# Patient Record
Sex: Female | Born: 1940 | Race: White | Hispanic: No | Smoking: Former smoker
Health system: Southern US, Community
[De-identification: ages and names within clinical notes are randomized; demographics above are authoritative.]

## PROBLEM LIST (undated history)

## (undated) DIAGNOSIS — F32A Depression, unspecified: Secondary | ICD-10-CM

## (undated) DIAGNOSIS — G35 Multiple sclerosis: Secondary | ICD-10-CM

## (undated) DIAGNOSIS — I10 Essential (primary) hypertension: Secondary | ICD-10-CM

## (undated) DIAGNOSIS — F329 Major depressive disorder, single episode, unspecified: Secondary | ICD-10-CM

## (undated) DIAGNOSIS — R413 Other amnesia: Secondary | ICD-10-CM

## (undated) DIAGNOSIS — H539 Unspecified visual disturbance: Secondary | ICD-10-CM

## (undated) DIAGNOSIS — E785 Hyperlipidemia, unspecified: Secondary | ICD-10-CM

## (undated) DIAGNOSIS — M1712 Unilateral primary osteoarthritis, left knee: Secondary | ICD-10-CM

## (undated) DIAGNOSIS — N3281 Overactive bladder: Secondary | ICD-10-CM

## (undated) HISTORY — DX: Unilateral primary osteoarthritis, left knee: M17.12

## (undated) HISTORY — DX: Depression, unspecified: F32.A

## (undated) HISTORY — DX: Unspecified visual disturbance: H53.9

## (undated) HISTORY — DX: Multiple sclerosis: G35

## (undated) HISTORY — DX: Major depressive disorder, single episode, unspecified: F32.9

## (undated) HISTORY — DX: Overactive bladder: N32.81

## (undated) HISTORY — DX: Essential (primary) hypertension: I10

## (undated) HISTORY — DX: Hyperlipidemia, unspecified: E78.5

## (undated) HISTORY — DX: Other amnesia: R41.3

## (undated) HISTORY — PX: CATARACT EXTRACTION: SUR2

---

## 1998-07-07 ENCOUNTER — Encounter: Admission: RE | Admit: 1998-07-07 | Discharge: 1998-10-05 | Payer: Self-pay | Admitting: Gynecology

## 1998-08-05 ENCOUNTER — Other Ambulatory Visit: Admission: RE | Admit: 1998-08-05 | Discharge: 1998-08-05 | Payer: Self-pay | Admitting: Gynecology

## 1998-10-07 ENCOUNTER — Encounter: Admission: RE | Admit: 1998-10-07 | Discharge: 1999-01-05 | Payer: Self-pay | Admitting: Gynecology

## 1999-04-30 ENCOUNTER — Encounter: Admission: RE | Admit: 1999-04-30 | Discharge: 1999-07-29 | Payer: Self-pay | Admitting: Gynecology

## 1999-09-27 ENCOUNTER — Other Ambulatory Visit: Admission: RE | Admit: 1999-09-27 | Discharge: 1999-09-27 | Payer: Self-pay | Admitting: Gynecology

## 2000-11-29 ENCOUNTER — Other Ambulatory Visit: Admission: RE | Admit: 2000-11-29 | Discharge: 2000-11-29 | Payer: Self-pay | Admitting: Gynecology

## 2003-03-29 HISTORY — PX: COLONOSCOPY W/ POLYPECTOMY: SHX1380

## 2003-06-12 ENCOUNTER — Encounter (INDEPENDENT_AMBULATORY_CARE_PROVIDER_SITE_OTHER): Payer: Self-pay | Admitting: Gastroenterology

## 2003-06-12 ENCOUNTER — Ambulatory Visit (HOSPITAL_COMMUNITY): Admission: RE | Admit: 2003-06-12 | Discharge: 2003-06-12 | Payer: Self-pay | Admitting: Gastroenterology

## 2003-06-12 ENCOUNTER — Encounter (INDEPENDENT_AMBULATORY_CARE_PROVIDER_SITE_OTHER): Payer: Self-pay | Admitting: Specialist

## 2003-06-12 DIAGNOSIS — Z860100 Personal history of colon polyps, unspecified: Secondary | ICD-10-CM | POA: Insufficient documentation

## 2004-02-17 ENCOUNTER — Other Ambulatory Visit: Admission: RE | Admit: 2004-02-17 | Discharge: 2004-02-17 | Payer: Self-pay | Admitting: Gynecology

## 2005-05-09 ENCOUNTER — Other Ambulatory Visit: Admission: RE | Admit: 2005-05-09 | Discharge: 2005-05-09 | Payer: Self-pay | Admitting: Gynecology

## 2006-06-29 ENCOUNTER — Other Ambulatory Visit: Admission: RE | Admit: 2006-06-29 | Discharge: 2006-06-29 | Payer: Self-pay | Admitting: Gynecology

## 2007-06-25 ENCOUNTER — Other Ambulatory Visit: Admission: RE | Admit: 2007-06-25 | Discharge: 2007-06-25 | Payer: Self-pay | Admitting: Gynecology

## 2007-08-03 DIAGNOSIS — G35 Multiple sclerosis: Secondary | ICD-10-CM | POA: Insufficient documentation

## 2007-08-06 ENCOUNTER — Ambulatory Visit: Payer: Self-pay | Admitting: Internal Medicine

## 2007-08-06 DIAGNOSIS — E669 Obesity, unspecified: Secondary | ICD-10-CM

## 2007-08-06 DIAGNOSIS — F411 Generalized anxiety disorder: Secondary | ICD-10-CM | POA: Insufficient documentation

## 2007-08-06 DIAGNOSIS — F329 Major depressive disorder, single episode, unspecified: Secondary | ICD-10-CM

## 2007-08-06 DIAGNOSIS — M129 Arthropathy, unspecified: Secondary | ICD-10-CM | POA: Insufficient documentation

## 2007-08-06 DIAGNOSIS — I10 Essential (primary) hypertension: Secondary | ICD-10-CM

## 2007-08-06 DIAGNOSIS — E785 Hyperlipidemia, unspecified: Secondary | ICD-10-CM | POA: Insufficient documentation

## 2007-08-06 DIAGNOSIS — E119 Type 2 diabetes mellitus without complications: Secondary | ICD-10-CM | POA: Insufficient documentation

## 2008-05-30 ENCOUNTER — Encounter (INDEPENDENT_AMBULATORY_CARE_PROVIDER_SITE_OTHER): Payer: Self-pay | Admitting: *Deleted

## 2009-08-05 ENCOUNTER — Encounter (INDEPENDENT_AMBULATORY_CARE_PROVIDER_SITE_OTHER): Payer: Self-pay | Admitting: *Deleted

## 2009-08-05 ENCOUNTER — Telehealth (INDEPENDENT_AMBULATORY_CARE_PROVIDER_SITE_OTHER): Payer: Self-pay | Admitting: *Deleted

## 2009-08-21 ENCOUNTER — Encounter (INDEPENDENT_AMBULATORY_CARE_PROVIDER_SITE_OTHER): Payer: Self-pay | Admitting: *Deleted

## 2009-08-26 ENCOUNTER — Encounter (INDEPENDENT_AMBULATORY_CARE_PROVIDER_SITE_OTHER): Payer: Self-pay | Admitting: *Deleted

## 2009-08-26 ENCOUNTER — Ambulatory Visit: Payer: Self-pay | Admitting: Internal Medicine

## 2009-08-28 ENCOUNTER — Telehealth (INDEPENDENT_AMBULATORY_CARE_PROVIDER_SITE_OTHER): Payer: Self-pay

## 2010-02-23 ENCOUNTER — Ambulatory Visit: Payer: Self-pay | Admitting: Gynecology

## 2010-02-23 ENCOUNTER — Other Ambulatory Visit: Admission: RE | Admit: 2010-02-23 | Discharge: 2010-02-23 | Payer: Self-pay | Admitting: Gynecology

## 2010-03-03 ENCOUNTER — Ambulatory Visit: Payer: Self-pay | Admitting: Gynecology

## 2010-03-24 ENCOUNTER — Telehealth: Payer: Self-pay | Admitting: Internal Medicine

## 2010-03-24 DIAGNOSIS — R195 Other fecal abnormalities: Secondary | ICD-10-CM

## 2010-03-26 ENCOUNTER — Encounter (INDEPENDENT_AMBULATORY_CARE_PROVIDER_SITE_OTHER): Payer: Self-pay | Admitting: *Deleted

## 2010-03-31 ENCOUNTER — Encounter: Payer: Self-pay | Admitting: Internal Medicine

## 2010-03-31 ENCOUNTER — Ambulatory Visit
Admission: RE | Admit: 2010-03-31 | Discharge: 2010-03-31 | Payer: Self-pay | Source: Home / Self Care | Attending: Internal Medicine | Admitting: Internal Medicine

## 2010-04-05 ENCOUNTER — Ambulatory Visit (HOSPITAL_COMMUNITY)
Admission: RE | Admit: 2010-04-05 | Discharge: 2010-04-05 | Payer: Self-pay | Source: Home / Self Care | Attending: Internal Medicine | Admitting: Internal Medicine

## 2010-04-05 ENCOUNTER — Encounter: Payer: Self-pay | Admitting: Internal Medicine

## 2010-04-12 LAB — GLUCOSE, CAPILLARY: Glucose-Capillary: 109 mg/dL — ABNORMAL HIGH (ref 70–99)

## 2010-04-27 NOTE — Miscellaneous (Signed)
Summary: [facility] PV  Clinical Lists Changes  Medications: Added new medication of MOVIPREP 100 GM  SOLR (PEG-KCL-NACL-NASULF-NA ASC-C) As per prep instructions. - Signed Rx of MOVIPREP 100 GM  SOLR (PEG-KCL-NACL-NASULF-NA ASC-C) As per prep instructions.;  #1 x 0;  Signed;  Entered by: Ezra Sites RN;  Authorized by: Iva Boop MD, [facility];  Method used: Electronically to [facility] [address], [place], [place], [place]  [address], Ph: [phone], Fax: [phone] Observations: Added new observation of NKA: T (08/26/2009 13:51)    Prescriptions: MOVIPREP 100 GM  SOLR (PEG-KCL-NACL-NASULF-NA ASC-C) As per prep instructions.  #1 x 0   Entered by:   Ezra Sites RN   Authorized by:   Iva Boop MD, [facility]   Signed by:   Ezra Sites RN on 08/26/2009   Method used:   Electronically to        [facility] (retail)       [address]       [facility]       [place], [place]  [address]       Ph: [phone]       Fax: [phone]   RxID:   [id number]

## 2010-04-27 NOTE — Letter (Signed)
Summary: Previsit letter  [facility]  [address] [place], [place] [address]   Phone: [phone]  Fax: [phone]       08/05/2009 MRN: [id number]  [facility] [address] [place], [place]  [address]  Dear Ms. Lembke,  Welcome to the Gastroenterology Division at [facility].    You are scheduled to see a nurse for your pre-procedure visit on 08/26/09 at 2:30pm on the 3rd floor at [facility], [address]  We ask that you try to arrive at our office 15 minutes prior to your appointment time to allow for check-in.  Your nurse visit will consist of discussing your medical and surgical history, your immediate family medical history, and your medications.    Please bring a complete list of all your medications or, if you prefer, bring the medication bottles and we will list them.  We will need to be aware of both prescribed and over the counter drugs.  We will need to know exact dosage information as well.  If you are on blood thinners (Coumadin, Plavix, Aggrenox, Ticlid, etc.) please call our office today/prior to your appointment, as we need to consult with your physician about holding your medication.   Please be prepared to read and sign documents such as consent forms, a financial agreement, and acknowledgement forms.  If necessary, and with your consent, a friend or relative is welcome to sit-in on the nurse visit with you.  Please bring your insurance card so that we may make a copy of it.  If your insurance requires a referral to see a specialist, please bring your referral form from your primary care physician.  No co-pay is required for this nurse visit.     If you cannot keep your appointment, please call [phone] to cancel or reschedule prior to your appointment date.  This allows [place] the opportunity to schedule an appointment for another patient in need of care.    Thank you for choosing [facility] for your medical needs.  We  appreciate the opportunity to care for you.  Please visit [place] at our website  to learn more about our practice.                     Sincerely.                                                                                                                   The Gastroenterology Division

## 2010-04-27 NOTE — Progress Notes (Signed)
Summary: Schedule recall colonscopy  Phone Note Outgoing Call Call back at [facility] Phone [phone]   Call placed by: Christie Nottingham CMA [facility]),  Aug 05, 2009 4:03 PM Call placed to: Patient Summary of Call: Called pt to schedule recall colonoscopy and pt scheduled for 08/28/09. Letter mailed for reminder appt.  Initial call taken by: Christie Nottingham CMA [facility]),  Aug 05, 2009 4:03 PM

## 2010-04-27 NOTE — Letter (Signed)
Summary: Diabetic Instructions  [facility]  [address] [place], [place] [address]   Phone: [phone]  Fax: [phone]    Elaine Wells 11-Feb-1941 MRN: [id number]   _  _   ORAL DIABETIC MEDICATION INSTRUCTIONS  The day before your procedure:   Take your diabetic pill as you do normally  The day of your procedure:   Do not take your diabetic pill    We will check your blood sugar levels during the admission process and again in Recovery before discharging you home  ________________________________________________________________________

## 2010-04-27 NOTE — Progress Notes (Signed)
Summary: Cancel colonoscopy  Patient came in today on the 4th floor  for her scheduled colonoscopy , she reported to the reception desk she wanted to have her procedure at the hospital and not in [facility].  Patient's reason was her husband died during a procedure and she was too scared to have a procedure out of the hospital.  Quincy Carnes, RN attempted to speak with her and reassure her that it is safe to have the procedure here. She states that when she came for the pre-visit she thought she would be able to have it done here, but now she feels she won't be safe unless she is in the hospital.  Patient states no one can change her mind and she will only have the colon at the hospital.  I spoke with Dr Leone Payor, he is unable to accomodate her today and asked me to help her reschedule to his next hospital week.  I attempted to reschedule the patient, but she refused.  Patient  says she wants to think about it and call us back if she is interested.  We have canceled her appointment.  Darcey Nora RN, [facility]  August 28, 2009 2:56 PM

## 2010-04-27 NOTE — Letter (Signed)
Summary: [facility] Instructions  [facility]  [address] [place], [place] [address]   Phone: [phone]  Fax: [phone]       Elaine Wells    09-May-1940    MRN: [id number]        Procedure Day /Date: Friday 08/28/2009     Arrival Time: 2:00 pm      Procedure Time: 3:00 pm     Location of Procedure:                    _ x_  [facility] (4th Floor)                        PREPARATION FOR COLONOSCOPY WITH MOVIPREP   Starting 5 days prior to your procedure Sunday 5/29 do not eat nuts, seeds, popcorn, corn, beans, peas,  salads, or any raw vegetables.  Do not take any fiber supplements (e.g. Metamucil, Citrucel, and Benefiber).  THE DAY BEFORE YOUR PROCEDURE         DATE: Thursday 6/2  1.  Drink clear liquids the entire day-NO SOLID FOOD  2.  Do not drink anything colored red or purple.  Avoid juices with pulp.  No orange juice.  3.  Drink at least 64 oz. (8 glasses) of fluid/clear liquids during the day to prevent dehydration and help the prep work efficiently.  CLEAR LIQUIDS INCLUDE: Water Jello Ice Popsicles Tea (sugar ok, no milk/cream) Powdered fruit flavored drinks Coffee (sugar ok, no milk/cream) Gatorade Juice: apple, white grape, white cranberry  Lemonade Clear bullion, consomm, broth Carbonated beverages (any kind) Strained chicken noodle soup Hard Candy                             4.  In the morning, mix first dose of MoviPrep solution:    Empty 1 Pouch A and 1 Pouch B into the disposable container    Add lukewarm drinking water to the top line of the container. Mix to dissolve    Refrigerate (mixed solution should be used within 24 hrs)  5.  Begin drinking the prep at 5:00 p.m. The MoviPrep container is divided by 4 marks.   Every 15 minutes drink the solution down to the next mark (approximately 8 oz) until the full liter is complete.   6.  Follow completed prep with 16 oz of clear liquid of your choice (Nothing red or  purple).  Continue to drink clear liquids until bedtime.  7.  Before going to bed, mix second dose of MoviPrep solution:    Empty 1 Pouch A and 1 Pouch B into the disposable container    Add lukewarm drinking water to the top line of the container. Mix to dissolve    Refrigerate  THE DAY OF YOUR PROCEDURE      DATE: Friday 6/3  Beginning at 10:00 a.m. (5 hours before procedure):         1. Every 15 minutes, drink the solution down to the next mark (approx 8 oz) until the full liter is complete.  2. Follow completed prep with 16 oz. of clear liquid of your choice.    3. You may drink clear liquids until 10:00 am  (2 HOURS BEFORE PROCEDURE).   MEDICATION INSTRUCTIONS  Unless otherwise instructed, you should take regular prescription medications with a small sip of water   as early as possible the morning of  your procedure.  Diabetic patients - see separate instructions.  Additional medication instructions:  Do not take Lisinopril/HCTZ day of procedure.         OTHER INSTRUCTIONS  You will need a responsible adult at least 70 years of age to accompany you and drive you home.   This person must remain in the waiting room during your procedure.  Wear loose fitting clothing that is easily removed.  Leave jewelry and other valuables at home.  However, you may wish to bring a book to read or  an iPod/MP3 player to listen to music as you wait for your procedure to start.  Remove all body piercing jewelry and leave at home.  Total time from sign-in until discharge is approximately 2-3 hours.  You should go home directly after your procedure and rest.  You can resume normal activities the  day after your procedure.  The day of your procedure you should not:   Drive   Make legal decisions   Operate machinery   Drink alcohol   Return to work  You will receive specific instructions about eating, activities and medications before you leave.    The above  instructions have been reviewed and explained to me by   Ezra Sites RN  August 26, 2009 2:32 PM     I fully understand and can verbalize these instructions _____________________________ Date _________

## 2010-04-29 NOTE — Miscellaneous (Signed)
Summary: [facility] PV  Clinical Lists Changes  Medications: Added new medication of MOVIPREP 100 GM  SOLR (PEG-KCL-NACL-NASULF-NA ASC-C) As per prep instructions. - Signed Rx of MOVIPREP 100 GM  SOLR (PEG-KCL-NACL-NASULF-NA ASC-C) As per prep instructions.;  #1 x 0;  Signed;  Entered by: Ezra Sites RN;  Authorized by: Iva Boop MD, [facility];  Method used: Electronically to [facility] [address] [place], [place], [place]  [address], Ph: [phone], Fax: [phone] Observations: Added new observation of NKA: T (03/31/2010 14:52)    Prescriptions: MOVIPREP 100 GM  SOLR (PEG-KCL-NACL-NASULF-NA ASC-C) As per prep instructions.  #1 x 0   Entered by:   Ezra Sites RN   Authorized by:   Iva Boop MD, [facility]   Signed by:   Ezra Sites RN on 03/31/2010   Method used:   Electronically to        [facility] (retail)       [address]       [place]       [place], [place]  [address]       Ph: [phone]       Fax: [phone]   RxID:   [id number]

## 2010-04-29 NOTE — Procedures (Signed)
Summary: Colon Instructions  Colon Instructions   Imported By: Lester Wagram 04/06/2010 08:49:40  _____________________________________________________________________  External Attachment:    Type:   Image     Comment:   External Document

## 2010-04-29 NOTE — Letter (Signed)
Summary: [facility] Instructions  [facility]  [address] [place], [place] [address]   Phone: [phone]  Fax: [phone]       LOUANN HOPSON    Aug 18, 1954    MRN: [id number]        Procedure Day /Date: Monday 04/05/2010     Arrival Time: 7:30AM     Procedure Time: 8:30AM     Location of Procedure:                     _X_  [facility] ( Outpatient Registration)                       PREPARATION FOR COLONOSCOPY WITH MOVIPREP   Starting 5 days prior to your procedure 03/31/2010 do not eat nuts, seeds, popcorn, corn, beans, peas,  salads, or any raw vegetables.  Do not take any fiber supplements (e.g. Metamucil, Citrucel, and Benefiber).  THE DAY BEFORE YOUR PROCEDURE         DATE: 1/8     DAY: [place]  1.  Drink clear liquids the entire day-NO SOLID FOOD  2.  Do not drink anything colored red or purple.  Avoid juices with pulp.  No orange juice.  3.  Drink at least 64 oz. (8 glasses) of fluid/clear liquids during the day to prevent dehydration and help the prep work efficiently.  CLEAR LIQUIDS INCLUDE: Water Jello Ice Popsicles Tea (sugar ok, no milk/cream) Powdered fruit flavored drinks Coffee (sugar ok, no milk/cream) Gatorade Juice: apple, white grape, white cranberry  Lemonade Clear bullion, consomm, broth Carbonated beverages (any kind) Strained chicken noodle soup Hard Candy                             4.  In the morning, mix first dose of MoviPrep solution:    Empty 1 Pouch A and 1 Pouch B into the disposable container    Add lukewarm drinking water to the top line of the container. Mix to dissolve    Refrigerate (mixed solution should be used within 24 hrs)  5.  Begin drinking the prep at 5:00 p.m. The MoviPrep container is divided by 4 marks.   Every 15 minutes drink the solution down to the next mark (approximately 8 oz) until the full liter is complete.   6.  Follow completed prep with 16 oz of clear liquid of your choice  (Nothing red or purple).  Continue to drink clear liquids until bedtime.  7.  Before going to bed, mix second dose of MoviPrep solution:    Empty 1 Pouch A and 1 Pouch B into the disposable container    Add lukewarm drinking water to the top line of the container. Mix to dissolve    Refrigerate  THE DAY OF YOUR PROCEDURE      DATE: 1/9     DAY: Monday  Beginning at 3:30AM (5 hours before procedure):         1. Every 15 minutes, drink the solution down to the next mark (approx 8 oz) until the full liter is complete.  2. Follow completed prep with 16 oz. of clear liquid of your choice.    3. You may drink clear liquids until 6:30AM (2 HOURS BEFORE PROCEDURE).   MEDICATION INSTRUCTIONS  Unless otherwise instructed, you should take regular prescription medications with a small sip of water   as early as possible  the morning of your procedure.  Diabetic patients - see separate instructions.    Additional medication instructions: Do not take Lisinopril/HCTZ day of procedure.         OTHER INSTRUCTIONS  You will need a responsible adult at least 70 years of age to accompany you and drive you home.   This person must remain in the waiting room during your procedure.  Wear loose fitting clothing that is easily removed.  Leave jewelry and other valuables at home.  However, you may wish to bring a book to read or  an iPod/MP3 player to listen to music as you wait for your procedure to start.  Remove all body piercing jewelry and leave at home.  Total time from sign-in until discharge is approximately 2-3 hours.  You should go home directly after your procedure and rest.  You can resume normal activities the  day after your procedure.  The day of your procedure you should not:   Drive   Make legal decisions   Operate machinery   Drink alcohol   Return to work  You will receive specific instructions about eating, activities and medications before you  leave.    The above instructions have been reviewed and explained to me by   Ezra Sites RN  March 31, 2010 3:35 PM    I fully understand and can verbalize these instructions _____________________________ Date _________

## 2010-04-29 NOTE — Letter (Signed)
Summary: Diabetic Instructions  [facility]  [address] [place], [place] [address]   Phone: [phone]  Fax: [phone]    Elaine Wells 1940/10/17 MRN: [id number]   Metformin  ORAL DIABETIC MEDICATION INSTRUCTIONS  The day before your procedure:   Take your diabetic pill as you do normally  The day of your procedure:   Do not take your diabetic pill    We will check your blood sugar levels during the admission process and again in Recovery before discharging you home  ________________________________________________________________________

## 2010-04-29 NOTE — Progress Notes (Signed)
Summary: Hospital Procedure  Phone Note Call from Patient Call back at Home Phone [phone]   Caller: Patient Call For: Dr. Leone Payor Reason for Call: Talk to Nurse Summary of Call: Pt wants to schedule Colon in hospital, next available Initial call taken by: [place] Johnson,  March 24, 2010 10:43 AM  Follow-up for Phone Call        Dr Elzie Rings see phone note fom June.  Can I still schedule her for the next hospital week, or does she need an appointment to come in and see you to discuss her fears? Follow-up by: Darcey Nora RN, CGRN,  March 24, 2010 11:11 AM  Additional Follow-up for Phone Call Additional follow up Details #1::        ok to schedule for [facility] in Jan reason is screening and fam hx colon cancer - but ask her how old her mother was when she was dx with colon cancer - as with new guidelines if mom was edlerly may not need routine colonoscopy now per new guidelines so if mom was older than 83's (70+) would hold off if not then schedule Iva Boop MD, [facility]  March 24, 2010 11:41 AM   New Problems: BLOOD IN STOOL, OCCULT (ICD-792.1)   Additional Follow-up for Phone Call Additional follow up Details #2::    Motehr was 72 when diagnosed.  She reports that 2 weeks ago when she was in Dr Lily Peer office she was heme positive.  I have scheduled her for 04/05/10 8:30 [facility].  She will ask Dr Fontaine No office to please fax labs here for our records. Follow-up by: Darcey Nora RN, CGRN,  March 24, 2010 3:19 PM  Additional Follow-up for Phone Call Additional follow up Details #3:: Details for Additional Follow-up Action Taken: ok. indication for colonoscopy is heme + stool.  Additional Follow-up by: Iva Boop MD, [facility],  March 24, 2010 5:05 PM  New Problems: BLOOD IN STOOL, OCCULT (ICD-792.1)

## 2010-04-29 NOTE — Procedures (Signed)
Summary: Colonoscopy  Patient: Elaine Wells Note: All result statuses are Final unless otherwise noted.  Tests: (1) Colonoscopy (COL)   COL Colonoscopy           DONE     [facility]     [address] [place], [place]  [address]           COLONOSCOPY PROCEDURE REPORT           PATIENT:  Elaine, Wells  MR#:  [id number]     BIRTHDATE:  1940/06/06, 69 yrs. old  GENDER:  female     ENDOSCOPIST:  Iva Boop, MD, [facility]     REF. BY:     PROCEDURE DATE:  04/05/2010     PROCEDURE:  Colonoscopy [address]     ASA CLASS:  Class II     INDICATIONS:  surveillance and high-risk screening, family history     of colon cancer, history of polyps mother probably diagnosed in     early 51's     MEDICATIONS:   Fentanyl 75 mcg IV, Versed 7 mg IV           DESCRIPTION OF PROCEDURE:   After the risks benefits and     alternatives of the procedure were thoroughly explained, informed     consent was obtained.  Digital rectal exam was performed and     revealed no abnormalities.   The  endoscope was introduced through     the anus and advanced to the cecum, which was identified by both     the appendix and ileocecal valve, without limitations.  The     quality of the prep was excellent, using MoviPrep.  The instrument     was then slowly withdrawn as the colon was fully examined.     Insertion time: 4 minutes. Withdrawl time: 7 minutes.     <<PROCEDUREIMAGES>>           FINDINGS:  A normal appearing cecum, ileocecal valve, and     appendiceal orifice were identified. The ascending, hepatic     flexure, transverse, splenic flexure, descending, sigmoid colon,     and rectum appeared unremarkable.   Retroflexed views in the     rectum revealed no abnormalities.    The scope was then withdrawn     from the patient and the procedure completed.           COMPLICATIONS:  None     ENDOSCOPIC IMPRESSION:     1) Normal colonoscopy with excellent prep     2) Personal history of polyps in past (? if  any were adenomas)     3) Family history of colon cancer - mother likely found in her     early 14's           REPEAT EXAM:  In 5 year(s) for routine screening colonoscopy.           Iva Boop, MD, [facility]           CC:  The Patient     Elaine Massed, MD Elaine Wells, [place])           n.     eSIGNED:   Iva Boop at 04/05/2010 09:15 AM           Kenna Gilbert, [phone]  Note: An exclamation mark (!) indicates a result that was not dispersed into the flowsheet. Document Creation Date: 04/05/2010 9:15 AM _______________________________________________________________________  (1) Order  result status: Final Collection or observation date-time: 04/05/2010 09:06 Requested date-time:  Receipt date-time:  Reported date-time:  Referring Physician:   Ordering Physician: Stan Head [phone]) Specimen Source:  Source: Launa Grill Order Number: [id number] Lab site:

## 2011-02-24 ENCOUNTER — Encounter: Payer: Self-pay | Admitting: Anesthesiology

## 2011-02-28 ENCOUNTER — Other Ambulatory Visit (HOSPITAL_COMMUNITY)
Admission: RE | Admit: 2011-02-28 | Discharge: 2011-02-28 | Disposition: A | Discharge status: Home / Self Care | Payer: Medicare Other | Source: Ambulatory Visit | Attending: Gynecology | Admitting: Gynecology

## 2011-02-28 ENCOUNTER — Ambulatory Visit (INDEPENDENT_AMBULATORY_CARE_PROVIDER_SITE_OTHER): Payer: Medicare Other | Admitting: Gynecology

## 2011-02-28 ENCOUNTER — Encounter: Payer: Self-pay | Admitting: Gynecology

## 2011-02-28 VITALS — BP 120/60 | Ht 63.75 in | Wt 177.0 lb

## 2011-02-28 DIAGNOSIS — M949 Disorder of cartilage, unspecified: Secondary | ICD-10-CM

## 2011-02-28 DIAGNOSIS — K921 Melena: Secondary | ICD-10-CM

## 2011-02-28 DIAGNOSIS — Z124 Encounter for screening for malignant neoplasm of cervix: Secondary | ICD-10-CM | POA: Insufficient documentation

## 2011-02-28 DIAGNOSIS — Z01419 Encounter for gynecological examination (general) (routine) without abnormal findings: Secondary | ICD-10-CM

## 2011-02-28 DIAGNOSIS — Z0000 Encounter for general adult medical examination without abnormal findings: Secondary | ICD-10-CM

## 2011-02-28 LAB — POC HEMOCCULT BLD/STL (OFFICE/1-CARD/DIAGNOSTIC): Fecal Occult Blood, POC: POSITIVE

## 2011-02-28 NOTE — Progress Notes (Signed)
Elaine Wells 1940/12/06 [id number]   History:    70 y.o.  for gynecological exam. Patient had a colonoscopy last year which was benign although she's had history in the past of colonic polyps. Review of her record indicated she had a bone density study in 2011 with osteopenia been evident. She is taking her calcium and vitamin D twice a day. Her mammogram was done earlier today. She does her monthly self breast examination. Dr. Carollee Massed is her primary physician who is monitoring her type 2 diabetes, hypercholesterolemia, and hypertension. Her labs were done 2 weeks ago. She's also been followed by a neurologist at [facility] for her multiple sclerosis for which she's currently on remission.  Past medical history,surgical history, family history and social history were all reviewed and documented in the EPIC chart.  Gynecologic History Patient's last menstrual period was 02/27/1994. Contraception: none Last Pap: 2011. Results were: normal Last mammogram: 2012. Results were: Results pending  Obstetric History OB History    Grav Para Term Preterm Abortions TAB SAB Ect Mult Living   3 3 3       3      # Outc Date GA Lbr Len/2nd Wgt Sex Del Anes PTL Lv   1 TRM 4/60   9lb4oz(4.196kg) M FCP   Yes   2 TRM 5/62   8lb11oz(3.941kg) M SVD   Yes   3 TRM 5/66   8lb5oz(3.771kg) F SVD   Yes       ROS:  Was performed and pertinent positives and negatives are included in the history.  Exam: chaperone present  BP 120/60  Ht 5' 3.75" (1.619 m)  Wt 177 lb (80.287 kg)  BMI 30.62 kg/m2  LMP 02/27/1994  Body mass index is 30.62 kg/(m^2).  General appearance : Well developed well nourished female. No acute distress HEENT: Neck supple, trachea midline, no carotid bruits, no thyroidmegaly Lungs: Clear to auscultation, no rhonchi or wheezes, or rib retractions  Heart: Regular rate and rhythm, no murmurs or gallops Breast:Examined in sitting and supine position were symmetrical in  appearance, no palpable masses or tenderness,  no skin retraction, no nipple inversion, no nipple discharge, no skin discoloration, no axillary or supraclavicular lymphadenopathy Abdomen: no palpable masses or tenderness, no rebound or guarding Extremities: no edema or skin discoloration or tenderness  Pelvic:  Bartholin, Urethra, Skene Glands: Within normal limits             Vagina: No gross lesions or discharge  Cervix: No gross lesions or discharge  Uterus  anteverted, normal size, shape and consistency, non-tender and mobile  Adnexa  Without masses or tenderness  Anus and perineum  normal   Rectovaginal  normal sphincter tone without palpated masses or tenderness             Hemoccult done results pending at time of this dictation     Assessment/Plan:  70 y.o. female normal gynecological examination. We discussed the following new guidelines:  Screening methods for cervical cancer: Joint Recommendations of the American Cancer Society, the American Society ofColposcopy and Cervical Pathology (ASCCP), and the American Society for Clinical Pathology:  Women's age 66-29 years should be tested with cervical cytology alone, and screening should be performed every 3 years. Co-testing should not be performed in women younger than 70 years of age. For women age 30-108 years of age, co-testing with cytology and HPV testing every 5 years is preferred. Screening with cytology alone every 3 years is acceptable.    Women  older than 65 years no screening is necessary after adequate negative prior screen results as with this patient.  Will followup with a bone density study next year fecal occult blood testing done today results pending at time of this dictation. She was encouraged to continue her weightbearing exercises for osteoporosis prevention and to continue to take her calcium and vitamin D.    [name] MD, 5:22 PM 02/28/2011

## 2011-03-01 ENCOUNTER — Encounter: Payer: Self-pay | Admitting: *Deleted

## 2011-03-03 ENCOUNTER — Encounter: Payer: Self-pay | Admitting: Gynecology

## 2013-02-18 DIAGNOSIS — K644 Residual hemorrhoidal skin tags: Secondary | ICD-10-CM | POA: Insufficient documentation

## 2013-04-04 ENCOUNTER — Other Ambulatory Visit: Payer: Self-pay | Admitting: *Deleted

## 2013-04-04 DIAGNOSIS — M8580 Other specified disorders of bone density and structure, unspecified site: Secondary | ICD-10-CM

## 2013-04-09 ENCOUNTER — Ambulatory Visit (INDEPENDENT_AMBULATORY_CARE_PROVIDER_SITE_OTHER): Payer: Medicare Other | Admitting: Gynecology

## 2013-04-09 ENCOUNTER — Encounter: Payer: Self-pay | Admitting: Gynecology

## 2013-04-09 VITALS — BP 128/72 | Ht 63.0 in | Wt 178.6 lb

## 2013-04-09 DIAGNOSIS — B9689 Other specified bacterial agents as the cause of diseases classified elsewhere: Secondary | ICD-10-CM

## 2013-04-09 DIAGNOSIS — L292 Pruritus vulvae: Secondary | ICD-10-CM

## 2013-04-09 DIAGNOSIS — L538 Other specified erythematous conditions: Secondary | ICD-10-CM

## 2013-04-09 DIAGNOSIS — L293 Anogenital pruritus, unspecified: Secondary | ICD-10-CM

## 2013-04-09 DIAGNOSIS — Z780 Asymptomatic menopausal state: Secondary | ICD-10-CM

## 2013-04-09 DIAGNOSIS — N760 Acute vaginitis: Secondary | ICD-10-CM

## 2013-04-09 DIAGNOSIS — A499 Bacterial infection, unspecified: Secondary | ICD-10-CM

## 2013-04-09 DIAGNOSIS — L304 Erythema intertrigo: Secondary | ICD-10-CM

## 2013-04-09 DIAGNOSIS — Z79890 Hormone replacement therapy (postmenopausal): Secondary | ICD-10-CM

## 2013-04-09 DIAGNOSIS — N393 Stress incontinence (female) (male): Secondary | ICD-10-CM

## 2013-04-09 DIAGNOSIS — N952 Postmenopausal atrophic vaginitis: Secondary | ICD-10-CM

## 2013-04-09 LAB — WET PREP FOR TRICH, YEAST, CLUE
Trich, Wet Prep: NONE SEEN
Yeast Wet Prep HPF POC: NONE SEEN

## 2013-04-09 MED ORDER — NYSTATIN-TRIAMCINOLONE 100000-0.1 UNIT/GM-% EX CREA: 1.0000 "application " | TOPICAL_CREAM | Freq: Three times a day (TID) | CUTANEOUS | Status: DC

## 2013-04-09 MED ORDER — METRONIDAZOLE 500 MG PO TABS: 500.0000 mg | ORAL_TABLET | Freq: Two times a day (BID) | ORAL | Status: DC

## 2013-04-09 MED ORDER — ESTRADIOL 0.1 MG/GM VA CREA: TOPICAL_CREAM | VAGINAL | Status: DC

## 2013-04-09 NOTE — Patient Instructions (Addendum)
Intertrigo Intertrigo is a skin condition that occurs in between folds of skin in places on the body that rub together a lot and do not get much ventilation. It is caused by heat, moisture, friction, sweat retention, and lack of air circulation, which produces red, irritated patches and, sometimes, scaling or drainage. People who have diabetes, who are obese, or who have treatment with antibiotics are at increased risk for intertrigo. The most common sites for intertrigo to occur include:  The groin.  The breasts.  The armpits.  Folds of abdominal skin.  Webbed spaces between the fingers or toes. Intertrigo may be aggravated by:  Sweat.  Feces.  Yeast or bacteria that are present near skin folds.  Urine.  Vaginal discharge. HOME CARE INSTRUCTIONS  The following steps can be taken to reduce friction and keep the affected area cool and dry:  Expose skin folds to the air.  Keep deep skin folds separated with cotton or linen cloth. Avoid tight fitting clothing that could cause chafing.  Wear open-toed shoes or sandals to help reduce moisture between the toes.  Apply absorbent powders to affected areas as directed by your caregiver.  Apply over-the-counter barrier pastes, such as zinc oxide, as directed by your caregiver.  If you develop a fungal infection in the affected area, your caregiver may have you use antifungal creams. SEEK MEDICAL CARE IF:   The rash is not improving after 1 week of treatment.  The rash is getting worse (more red, more swollen, more painful, or spreading).  You have a fever or chills. MAKE SURE YOU:   Understand these instructions.  Will watch your condition.  Will get help right away if you are not doing well or get worse. Document Released: 03/14/2005 Document Revised: 06/06/2011 Document Reviewed: 08/27/2009 ExitCare Patient Information 2014 [employer].  

## 2013-04-10 NOTE — Progress Notes (Signed)
Elaine Wells [birth year] [id number]   History:    73 y.o.  for GYN followup exam. Patient had been complaining of vulvar pruritus and slight discharge and irritation near her groin region. Patient has suffered a time from urinary incontinence as a result of her multiple sclerosis for which she is being followed by her neurologist at [facility] but she states she has not seen him several years and she has been in remission. Patient with past history of colon polyps her last colonoscopy was in 2011. Her last bone density study was in 2011 as well with evidence of osteopenia. Patient stated that she had her mammogram done on the day of this visit on January 13 as well as her bone density study for which results are pending at time of this dictation. Patient with no prior history of abnormal Pap smears. Dr. Lyn HollingsheadAlexander is patient's PCP who has been doing her blood work. Patient had not been seen in the office since December of 2012.   Past medical history,surgical history, family history and social history were all reviewed and documented in the EPIC chart.  Gynecologic History Patient's last menstrual period was 02/27/1994. Contraception: post menopausal status Last Pap: 2012. Results were: normal Last mammogram: 2015. Results were: Results pending  Obstetric History OB History  Gravida Para Term Preterm AB SAB TAB Ectopic Multiple Living  3 3 3       3     # Outcome Date GA Lbr Len/2nd Weight Sex Delivery Anes PTL Lv  3 TRM 08/01/64   8 lb 5 oz (3.771 kg) F SVD   Y  2 TRM 08/23/60   8 lb 11 oz (3.941 kg) M SVD   Y  1 TRM 06/30/58   9 lb 4 oz (4.196 kg) M FCP   Y       ROS: A ROS was performed and pertinent positives and negatives are included in the history.  GENERAL: No fevers or chills. HEENT: No change in vision, no earache, sore throat or sinus congestion. NECK: No pain or stiffness. CARDIOVASCULAR: No chest pain or pressure. No palpitations. PULMONARY: No shortness of breath,  cough or wheeze. GASTROINTESTINAL: No abdominal pain, nausea, vomiting or diarrhea, melena or bright red blood per rectum. GENITOURINARY: No urinary frequency, urgency, hesitancy or dysuria. MUSCULOSKELETAL: No joint or muscle pain, no back pain, no recent trauma. DERMATOLOGIC: No rash, no itching, no lesions. ENDOCRINE: No polyuria, polydipsia, no heat or cold intolerance. No recent change in weight. HEMATOLOGICAL: No anemia or easy bruising or bleeding. NEUROLOGIC: No headache, seizures, numbness, tingling or weakness. PSYCHIATRIC: No depression, no loss of interest in normal activity or change in sleep pattern.     Exam: chaperone present  BP 128/72  Ht 5\' 3"  (1.6 m)  Wt 178 lb 9.6 oz (81.012 kg)  BMI 31.65 kg/m2  LMP 02/27/1994  Body mass index is 31.65 kg/(m^2).  General appearance : Well developed well nourished female. No acute distress HEENT: Neck supple, trachea midline, no carotid bruits, no thyroidmegaly Lungs: Clear to auscultation, no rhonchi or wheezes, or rib retractions  Heart: Regular rate and rhythm, no murmurs or gallops Breast:Examined in sitting and supine position were symmetrical in appearance, no palpable masses or tenderness,  no skin retraction, no nipple inversion, no nipple discharge, no skin discoloration, no axillary or supraclavicular lymphadenopathy Abdomen: no palpable masses or tenderness, no rebound or guarding Extremities: Intertrigo groin region bilateral  Pelvic:  Bartholin, Urethra, Skene Glands: Within normal limits  Vagina: No gross lesions or discharge  Cervix: No gross lesions or discharge  Uterus  anteverted, normal size, shape and consistency, non-tender and mobile  Adnexa  Without masses or tenderness  Anus and perineum  normal   Rectovaginal  normal sphincter tone without palpated masses or tenderness             Hemoccult PCP provides   Wet prep Pos Amine, moderate clue cells and many bacteria  Assessment/Plan:  73 y.o.  female with intertrigo at the groin region will be prescribed nitrates cream to apply to 3 times a day for the next 7-10 days. As a result of her MS and issues with incontinence I have asked her to use Vaseline petroleum gel twice a day after she completes the treatment for her intertrigo. Pap smear not done today in accordance to the new guidelines. For her bacterial vaginosis she will be prescribed Flagyl 500 mg one by mouth twice a day for 7 days. Patient states her vaccines are up-to-date. PCP will be drawn her blood work.  Note: This dictation was prepared with  Dragon/digital dictation along withSmart phrase technology. Any transcriptional errors that result from this process are unintentional.   [name] MD, 7:10 AM 04/10/2013

## 2013-04-11 ENCOUNTER — Encounter: Payer: Self-pay | Admitting: Gynecology

## 2013-04-15 ENCOUNTER — Encounter: Payer: Self-pay | Admitting: Gynecology

## 2013-05-03 ENCOUNTER — Other Ambulatory Visit: Payer: Self-pay

## 2013-05-03 MED ORDER — METRONIDAZOLE 500 MG PO TABS: 500.0000 mg | ORAL_TABLET | Freq: Two times a day (BID) | ORAL | Status: DC

## 2013-12-13 ENCOUNTER — Encounter: Payer: Self-pay | Admitting: Women's Health

## 2013-12-13 ENCOUNTER — Ambulatory Visit (INDEPENDENT_AMBULATORY_CARE_PROVIDER_SITE_OTHER): Payer: Medicare Other | Admitting: Women's Health

## 2013-12-13 DIAGNOSIS — B3731 Acute candidiasis of vulva and vagina: Secondary | ICD-10-CM

## 2013-12-13 DIAGNOSIS — N898 Other specified noninflammatory disorders of vagina: Secondary | ICD-10-CM

## 2013-12-13 DIAGNOSIS — B373 Candidiasis of vulva and vagina: Secondary | ICD-10-CM

## 2013-12-13 DIAGNOSIS — R350 Frequency of micturition: Secondary | ICD-10-CM

## 2013-12-13 DIAGNOSIS — L293 Anogenital pruritus, unspecified: Secondary | ICD-10-CM

## 2013-12-13 LAB — URINALYSIS W MICROSCOPIC + REFLEX CULTURE
Glucose, UA: NEGATIVE mg/dL
Hgb urine dipstick: NEGATIVE
Leukocytes, UA: NEGATIVE
Nitrite: NEGATIVE
Specific Gravity, Urine: 1.025 (ref 1.005–1.030)
Urobilinogen, UA: 1 mg/dL (ref 0.0–1.0)
pH: 5 (ref 5.0–8.0)

## 2013-12-13 LAB — WET PREP FOR TRICH, YEAST, CLUE
Clue Cells Wet Prep HPF POC: NONE SEEN
Trich, Wet Prep: NONE SEEN

## 2013-12-13 MED ORDER — FLUCONAZOLE 150 MG PO TABS: 150.0000 mg | ORAL_TABLET | Freq: Once | ORAL | Status: DC

## 2013-12-13 MED ORDER — NYSTATIN 100000 UNIT/GM EX CREA: 1.0000 "application " | TOPICAL_CREAM | Freq: Two times a day (BID) | CUTANEOUS | Status: DC

## 2013-12-13 NOTE — Patient Instructions (Signed)

## 2013-12-13 NOTE — Progress Notes (Signed)
Patient ID: Elaine Wells, female   DOB: Mar 23, 1941, 73 y.o.   MRN: [id number] Presents with complaint of severe vaginal itching, slight burning with urination, urine darker in color than usual, denies any pain with urination or fever.  MS, always has frequency of urination. Hypertension, hyperlipidemia, diabetes, depression-managed by primary care.  Exam: Appears well. Abdomen obese, nontender, no CVAT. External genitalia extremely excoriated in inguinal creases and at introitus extending to rectum. Wet prep done with a Q-tip, positive for yeast. UA: Negative  Yeast vaginitis  Plan: Nystatin cream externally twice daily to excoriated areas. Diflucan 150 by mouth today and repeat if needed. Yeast prevention discussed. Reviewed importance of healthy diet with low sugar content. Instructed to call if no relief.

## 2014-01-27 ENCOUNTER — Encounter: Payer: Self-pay | Admitting: Women's Health

## 2014-01-31 ENCOUNTER — Ambulatory Visit (INDEPENDENT_AMBULATORY_CARE_PROVIDER_SITE_OTHER): Payer: Medicare Other | Admitting: Women's Health

## 2014-01-31 ENCOUNTER — Encounter: Payer: Self-pay | Admitting: Women's Health

## 2014-01-31 VITALS — BP 162/89 | Ht 64.0 in | Wt 175.0 lb

## 2014-01-31 DIAGNOSIS — R350 Frequency of micturition: Secondary | ICD-10-CM

## 2014-01-31 DIAGNOSIS — N898 Other specified noninflammatory disorders of vagina: Secondary | ICD-10-CM

## 2014-01-31 LAB — WET PREP FOR TRICH, YEAST, CLUE
Clue Cells Wet Prep HPF POC: NONE SEEN
Trich, Wet Prep: NONE SEEN
WBC, Wet Prep HPF POC: NONE SEEN
Yeast Wet Prep HPF POC: NONE SEEN

## 2014-01-31 NOTE — Progress Notes (Signed)
Patient ID: Elaine Wells, female   DOB: 10/27/1940, 72 y.o.   MRN: [id number] Presents with complaint of dark urine and urinary incontinence.as had frequency for years, nocturia 3-4 times nightly, stress and urge incontinence.changes underwear and pants 2-3 times most days. Pads have caused irritation also. With questioning is not sure if has glaucoma.  Denies vaginal discharge or itching but states has vaginal and perineal irritation. History of MS, hypertension, hyperlipidemia, diabetes.  Exam: Appears well, external genitalia extremely erythematous extending to the groin to the rectum. No visible discharge, wet prep negative done with a Q-tip.  Urinary incontinence causing perineal excoriation Questionable glaucoma MS/hypertension/hyperlipidemia/diabetes  Plan: Reviewed unable to prescribe medication for overactive bladder due to questionable glaucoma. Has eye appointment next week will check to see if she has glaucoma,and will call back. Reviewed possible overactive bladder can be related to her multiple sclerosis as well as age. Reports urinary incontinence is her biggest problem. UA/culture pending.

## 2014-02-01 LAB — URINALYSIS W MICROSCOPIC + REFLEX CULTURE
Bacteria, UA: NONE SEEN
Casts: NONE SEEN
Crystals: NONE SEEN
Glucose, UA: NEGATIVE mg/dL
Hgb urine dipstick: NEGATIVE
Ketones, ur: NEGATIVE mg/dL
Leukocytes, UA: NEGATIVE
Nitrite: NEGATIVE
Protein, ur: NEGATIVE mg/dL
Specific Gravity, Urine: 1.025 (ref 1.005–1.030)
Urobilinogen, UA: 1 mg/dL (ref 0.0–1.0)
pH: 6 (ref 5.0–8.0)

## 2014-02-04 ENCOUNTER — Other Ambulatory Visit: Payer: Self-pay | Admitting: Women's Health

## 2014-02-04 ENCOUNTER — Telehealth: Payer: Self-pay | Admitting: *Deleted

## 2014-02-04 MED ORDER — SOLIFENACIN SUCCINATE 5 MG PO TABS: 5.0000 mg | ORAL_TABLET | Freq: Every day | ORAL | Status: DC

## 2014-02-04 NOTE — Telephone Encounter (Signed)
Telephone call, is having increased incontinence, dribbling, changing clothes  2-3 times daily. Has seen a urologist who wanted to use Botox for incontinence but refused-cost. Reviewed could not use anticholinergics if glaucoma, had eye appointment today no glaucoma will try Vesicare 5 mg once daily, instructed to call if cost is prohibitive. Call if no relief. Reviewed side effects of dry mouth, constipation, increase fiber rich foods in diet.

## 2014-02-04 NOTE — Telephone Encounter (Signed)
Pt called stating she does not have glaucoma and you can send Rx for pharmacy? Please advise

## 2014-02-22 ENCOUNTER — Other Ambulatory Visit: Payer: Self-pay | Admitting: Women's Health

## 2014-02-24 ENCOUNTER — Other Ambulatory Visit: Payer: Self-pay

## 2014-02-24 MED ORDER — NYSTATIN 100000 UNIT/GM EX CREA: TOPICAL_CREAM | CUTANEOUS | Status: DC

## 2014-02-24 NOTE — Telephone Encounter (Signed)
Ok, office visit if no relief 

## 2014-03-14 ENCOUNTER — Other Ambulatory Visit: Payer: Self-pay

## 2014-03-14 ENCOUNTER — Other Ambulatory Visit: Payer: Self-pay | Admitting: Women's Health

## 2014-03-14 MED ORDER — SOLIFENACIN SUCCINATE 5 MG PO TABS: 5.0000 mg | ORAL_TABLET | Freq: Every day | ORAL | Status: DC

## 2014-04-11 ENCOUNTER — Encounter: Payer: Self-pay | Admitting: Gynecology

## 2014-04-11 ENCOUNTER — Ambulatory Visit (INDEPENDENT_AMBULATORY_CARE_PROVIDER_SITE_OTHER): Payer: Medicare Other | Admitting: Gynecology

## 2014-04-11 VITALS — BP 126/78 | Ht 62.75 in | Wt 177.0 lb

## 2014-04-11 DIAGNOSIS — Z79890 Hormone replacement therapy (postmenopausal): Secondary | ICD-10-CM

## 2014-04-11 DIAGNOSIS — Z01419 Encounter for gynecological examination (general) (routine) without abnormal findings: Secondary | ICD-10-CM

## 2014-04-11 DIAGNOSIS — N952 Postmenopausal atrophic vaginitis: Secondary | ICD-10-CM

## 2014-04-11 MED ORDER — HYDROCORTISONE 2.5 % RE CREA: 1.0000 "application " | TOPICAL_CREAM | Freq: Two times a day (BID) | RECTAL | Status: DC

## 2014-04-11 MED ORDER — ESTRADIOL 0.1 MG/GM VA CREA: 2.0000 g | TOPICAL_CREAM | VAGINAL | Status: DC

## 2014-04-11 NOTE — Patient Instructions (Signed)
Shingles Vaccine What You Need to Know WHAT IS SHINGLES?  Shingles is a painful skin rash, often with blisters. It is also called Herpes Zoster or just Zoster.  A shingles rash usually appears on one side of the face or body and lasts from 2 to 4 weeks. Its main symptom is pain, which can be quite severe. Other symptoms of shingles can include fever, headache, chills, and upset stomach. Very rarely, a shingles infection can lead to pneumonia, hearing problems, blindness, brain inflammation (encephalitis), or death.  For about 1 person in 5, severe pain can continue even after the rash clears up. This is called post-herpetic neuralgia.  Shingles is caused by the Varicella Zoster virus. This is the same virus that causes chickenpox. Only someone who has had a case of chickenpox or rarely, has gotten chickenpox vaccine, can get shingles. The virus stays in your body. It can reappear many years later to cause a case of shingles.  You cannot catch shingles from another person with shingles. However, a person who has never had chickenpox (or chickenpox vaccine) could get chickenpox from someone with shingles. This is not very common.  Shingles is far more common in people 50 and older than in younger people. It is also more common in people whose immune systems are weakened because of a disease such as cancer or drugs such as steroids or chemotherapy.  At least 1 million people get shingles per year in the [place]. SHINGLES VACCINE  A vaccine for shingles was licensed in 2006. In clinical trials, the vaccine reduced the risk of shingles by 50%. It can also reduce the pain in people who still get shingles after being vaccinated.  A single dose of shingles vaccine is recommended for adults 60 years of age and older. SOME PEOPLE SHOULD NOT GET SHINGLES VACCINE OR SHOULD WAIT A person should not get shingles vaccine if he or she:  Has ever had a life-threatening allergic reaction to gelatin, the  antibiotic neomycin, or any other component of shingles vaccine. Tell your caregiver if you have any severe allergies.  Has a weakened immune system because of current:  AIDS or another disease that affects the immune system.  Treatment with drugs that affect the immune system, such as prolonged use of high-dose steroids.  Cancer treatment, such as radiation or chemotherapy.  Cancer affecting the bone marrow or lymphatic system, such as leukemia or lymphoma.  Is pregnant, or might be pregnant. Women should not become pregnant until at least 4 weeks after getting shingles vaccine. Someone with a minor illness, such as a cold, may be vaccinated. Anyone with a moderate or severe acute illness should usually wait until he or she recovers before getting the vaccine. This includes anyone with a temperature of 101.3 F (38 C) or higher. WHAT ARE THE RISKS FROM SHINGLES VACCINE?  A vaccine, like any medicine, could possibly cause serious problems, such as severe allergic reactions. However, the risk of a vaccine causing serious harm, or death, is extremely small.  No serious problems have been identified with shingles vaccine. Mild Problems  Redness, soreness, swelling, or itching at the site of the injection (about 1 person in 3).  Headache (about 1 person in 70). Like all vaccines, shingles vaccine is being closely monitored for unusual or severe problems. WHAT IF THERE IS A MODERATE OR SEVERE REACTION? What should I look for? Any unusual condition, such as a severe allergic reaction or a high fever. If a severe allergic reaction   occurred, it would be within a few minutes to an hour after the shot. Signs of a serious allergic reaction can include difficulty breathing, weakness, hoarseness or wheezing, a fast heartbeat, hives, dizziness, paleness, or swelling of the throat. What should I do?  Call your caregiver, or get the person to a caregiver right away.  Tell the caregiver what  happened, the date and time it happened, and when the vaccination was given.  Ask the caregiver to report the reaction by filing a Vaccine Adverse Event Reporting System (VAERS) form. Or, you can file this report through the VAERS web site at www.vaers.hhs.gov or by calling 1-800-822-7967. VAERS does not provide medical advice. HOW CAN I LEARN MORE?  Ask your caregiver. He or she can give you the vaccine package insert or suggest other sources of information.  Contact the Centers for Disease Control and Prevention (CDC):  Call 1-800-232-4636 (1-800-CDC-INFO).  Visit the CDC website at www.cdc.gov/vaccines CDC Shingles Vaccine VIS (01/01/08) Document Released: 01/09/2006 Document Revised: 06/06/2011 Document Reviewed: 07/04/2012 ExitCare Patient Information 2015 [employer]. This information is not intended to replace advice given to you by your health care provider. Make sure you discuss any questions you have with your health care provider.  

## 2014-04-11 NOTE — Progress Notes (Signed)
Elaine Wells 13-Feb-1941 [id number]   History:    74 y.o.  for annual gyn exam who has suffered a time from urinary incontinence as a result of her multiple sclerosis for which she is being followed by her neurologist at [facility] but she states she has not seen him several years and she has been in remission. Patient with past history of colon polyps  who is colonoscopy was in 2012. Her bone density study last year demonstrated the lowest T score was -1.7 at the left femoral neck and she had normal Frax analysis. Bone stability was reported with the exception increase bone mineralization at the AP spine probably attributed to degenerative changes. Patient is taking her calcium and vitamin D regularly. She received her flu vaccine but has not received her shingles vaccine yet.  . Dr. Lyn Hollingshead is patient's PCP who has been doing her blood work and has been taking care of her for hypertension and diabetes as well as hyperlipidemia.  Past medical history,surgical history, family history and social history were all reviewed and documented in the EPIC chart.  Gynecologic History Patient's last menstrual period was 02/27/1994. Contraception: post menopausal status Last Pap: 2012. Results were: normal Last mammogram: 2015. Results were: normal  Obstetric History OB History  Gravida Para Term Preterm AB SAB TAB Ectopic Multiple Living  # Outcome Date GA Lbr Len/2nd Weight Sex Delivery Anes PTL Lv  3 Term 08/01/64   8 lb 5 oz (3.771 kg) F Vag-Spont   Y  2 Term 08/23/60   8 lb 11 oz (3.941 kg) M Vag-Spont   Y  1 Term 06/30/58   9 lb 4 oz (4.196 kg) M Vag-Forceps   Y       ROS: A ROS was performed and pertinent positives and negatives are included in the history.  GENERAL: No fevers or chills. HEENT: No change in vision, no earache, sore throat or sinus congestion. NECK: No pain or stiffness. CARDIOVASCULAR: No chest pain or pressure. No palpitations. PULMONARY: No  shortness of breath, cough or wheeze. GASTROINTESTINAL: No abdominal pain, nausea, vomiting or diarrhea, melena or bright red blood per rectum. GENITOURINARY: No urinary frequency, urgency, hesitancy or dysuria. MUSCULOSKELETAL: No joint or muscle pain, no back pain, no recent trauma. DERMATOLOGIC: No rash, no itching, no lesions. ENDOCRINE: No polyuria, polydipsia, no heat or cold intolerance. No recent change in weight. HEMATOLOGICAL: No anemia or easy bruising or bleeding. NEUROLOGIC: No headache, seizures, numbness, tingling or weakness. PSYCHIATRIC: No depression, no loss of interest in normal activity or change in sleep pattern.     Exam: chaperone present  BP 126/78 mmHg  Ht 5' 2.75" (1.594 m)  Wt 177 lb (80.287 kg)  BMI 31.60 kg/m2  LMP 02/27/1994  Body mass index is 31.6 kg/(m^2).  General appearance : Well developed well nourished female. No acute distress HEENT: Neck supple, trachea midline, no carotid bruits, no thyroidmegaly Lungs: Clear to auscultation, no rhonchi or wheezes, or rib retractions  Heart: Regular rate and rhythm, no murmurs or gallops Breast:Examined in sitting and supine position were symmetrical in appearance, no palpable masses or tenderness,  no skin retraction, no nipple inversion, no nipple discharge, no skin discoloration, no axillary or supraclavicular lymphadenopathy Abdomen: no palpable masses or tenderness, no rebound or guarding Extremities: no edema or skin discoloration or tenderness  Pelvic:  Bartholin, Urethra, Skene Glands: Within normal limits  Vagina: No gross lesions or discharge  Cervix: No gross lesions or discharge  Uterus  anteverted, normal size, shape and consistency, non-tender and mobile  Adnexa  Without masses or tenderness  Anus and perineum  normal   Rectovaginal  normal sphincter tone without palpated masses or tenderness             Hemoccult PCP provides     Assessment/Plan:  74 y.o. female for annual exam who  has done well with vaginal estrogen twice a week for vaginal atrophy. Since she leaks urine at times she has use topical steroid cream which is help. I have recommended she apply 2-3 times a day by Vaseline cream to the area. She's to continue to use her Vesicare. She was reminded to continue her calcium and vitamin D and engage in some form of weightbearing exercises 3 times a week for osteoporosis prevention. She will need a bone density study next year. She needs to schedule her mammogram for this year. She will follow-up with her primary physician since she has had issues with memory loss and he had prescribed her Aricept and had additional questions for him. I've given her a prescription to take to her local pharmacy for her shingles vaccine.   [name] MD, 2:51 PM 04/11/2014

## 2014-05-05 ENCOUNTER — Encounter: Payer: Self-pay | Admitting: Neurology

## 2014-05-05 ENCOUNTER — Other Ambulatory Visit: Payer: Self-pay | Admitting: Neurology

## 2014-05-05 ENCOUNTER — Ambulatory Visit (INDEPENDENT_AMBULATORY_CARE_PROVIDER_SITE_OTHER): Payer: Medicare Other | Admitting: Neurology

## 2014-05-05 ENCOUNTER — Telehealth: Payer: Self-pay | Admitting: *Deleted

## 2014-05-05 VITALS — BP 146/70 | HR 68 | Resp 14 | Ht 64.0 in | Wt 173.2 lb

## 2014-05-05 DIAGNOSIS — F32A Depression, unspecified: Secondary | ICD-10-CM

## 2014-05-05 DIAGNOSIS — R5382 Chronic fatigue, unspecified: Secondary | ICD-10-CM

## 2014-05-05 DIAGNOSIS — R260 Ataxic gait: Secondary | ICD-10-CM | POA: Insufficient documentation

## 2014-05-05 DIAGNOSIS — F329 Major depressive disorder, single episode, unspecified: Secondary | ICD-10-CM

## 2014-05-05 DIAGNOSIS — R4189 Other symptoms and signs involving cognitive functions and awareness: Secondary | ICD-10-CM

## 2014-05-05 DIAGNOSIS — R0683 Snoring: Secondary | ICD-10-CM | POA: Insufficient documentation

## 2014-05-05 DIAGNOSIS — R350 Frequency of micturition: Secondary | ICD-10-CM

## 2014-05-05 DIAGNOSIS — G35 Multiple sclerosis: Secondary | ICD-10-CM

## 2014-05-05 NOTE — Telephone Encounter (Signed)
Release fax to [facility] medical records dept requesting notes and a copy of the the MRI brain report and CD.

## 2014-05-05 NOTE — Progress Notes (Signed)
[facility]  PATIENT: Elaine Wells DOB: 03/04/1941  REFERRING CLINICIAN: Cyndy Freeze  HISTORY FROM: Patient  REASON FOR VISIT: MS   HISTORICAL  CHIEF COMPLAINT:  Chief Complaint  Patient presents with  . Multiple Sclerosis    Sts dx with MS 20 yrs ago.  Presenting sx. numbness in arms/legs.  Sts she was on Betaseron until about 6 yrs ago and has not been on anything since. Today she is her with c/o worsening memory.  Sts. she has been on Aricept for about a month./fim    HISTORY OF PRESENT ILLNESS:  Elaine Wells is a 74 year old woman who was diagnosed with MS about 20 years ago. Prior to that point, she had symptoms with numbness in the left arm and also other episodes of numbness in the legs. However, an MRI was not done when she presented at that time and since she was having a lot of stress it was felt that there could've been psychologic reasons to her numbness.  Later, she had an MRI and was diagnosed with MS. She was initially placed on Betaseron but has not been on Betaseron or other disease modifying therapy for the past 6 years or so. She was seeing Dr. Leotis Shames until he moved to [place].    She continued to be seen at [facility] and was seen Dr. Renne Crigler.   She reports being told that her MS lesions were "dried up" and that she did not need to be on a medication.  Elaine Wells reports multiple symptoms. Her main problem she is most concerned about is decreased memory.  She feels her short-term memory is worse than the long-term memory. She also has difficulty coming up with the right words. She has difficulty focusing and doing certain cognitive tasks. She finds driving is more difficult and she is more concerned about driving when there is a lot of traffic. She feels her memory has improved some since taking Aricept.  She notes that her gait is little worse and that she no longer has endurance with her walking.   Her legs do not feel weak. However, she has a lot  of aching in her legs. She feels her balance is poor. She stumbles somewhat used to fall more years ago. She denies any numbness in the legs.  Elaine Wells reports that her vision function is fairly good, especially since after she has had cataract surgery. She has not had an episode of optic neuritis or diplopia.  She notes that she has urinary frequency and urgency. She does not have difficulty starting and she empties her bladder well. She was on Vesicare. It helped her bladder, but she stopped it because she wanted to simplify her medical regimen.  She notes that she has quite a bit of fatigue that often worsens as the day goes on. The cognitive fatigue is worse than the physical fatigue. She has never taken Provigil or a stimulant. She sleeps well at night. He snores but nobody has ever told her that she gasps or stops breathing at night.  She also reports some difficulty with mild depression and anxiety. Her anxiety is worse when she drives.  Her last MRI of the brain was probably 6 or 7 years ago at [facility].   REVIEW OF SYSTEMS:  Constitutional: No fevers, chills, sweats, or change in appetite.  She is fatigued easily Eyes: No visual changes, double vision, eye pain Ear, nose and throat: No hearing loss, ear pain, nasal congestion, sore  throat Cardiovascular: No chest pain, palpitations Respiratory:  No shortness of breath at rest or with exertion.   No wheezes GastrointestinaI: No nausea, vomiting, diarrhea, abdominal pain, fecal incontinence Genitourinary:  see above.    Musculoskeletal:  No neck pain, back pain Integumentary: No rash, pruritus, skin lesions Neurological: as above Psychiatric: Mild depression and anxiety Endocrine: No palpitations, diaphoresis, change in appetite, change in weigh or increased thirst Hematologic/Lymphatic:  No anemia, purpura, petechiae. Allergic/Immunologic: No itchy/runny eyes, nasal congestion, recent allergic reactions, rashes  ALLERGIES: No  Known Allergies  HOME MEDICATIONS: Outpatient Prescriptions Prior to Visit  Medication Sig Dispense Refill  . amLODipine (NORVASC) 10 MG tablet Take 10 mg by mouth daily.    Marland Kitchen aspirin 81 MG tablet Take 81 mg by mouth daily.      Marland Kitchen atenolol (TENORMIN) 50 MG tablet Take 50 mg by mouth daily.      . cholecalciferol (VITAMIN D) 1000 UNITS tablet Take 1,000 Units by mouth daily.     Marland Kitchen donepezil (ARICEPT) 5 MG tablet Take 5 mg by mouth at bedtime.    . Multiple Vitamin (MULTIVITAMIN) tablet Take 1 tablet by mouth daily.      . vitamin B-12 (CYANOCOBALAMIN) 100 MCG tablet Take 100 mcg by mouth daily.    . vitamin E 600 UNIT capsule Take 600 Units by mouth daily.    Marland Kitchen lisinopril (PRINIVIL,ZESTRIL) 5 MG tablet Take 20 mg by mouth daily.     Marland Kitchen estradiol (ESTRACE) 0.1 MG/GM vaginal cream Place 0.25 Applicatorfuls vaginally 2 (two) times a week. (Patient not taking: Reported on 05/05/2014) 42.5 g 8  . FLUoxetine (PROZAC) 20 MG capsule Take 20 mg by mouth daily.      . hydrocortisone (PROCTOSOL HC) 2.5 % rectal cream Place 1 application rectally 2 (two) times daily. (Patient not taking: Reported on 05/05/2014) 30 g 3  . nystatin cream (MYCOSTATIN) APPLY 1 APPLICATION TOPICALLY 2 (TWO) TIMES DAILY. (Patient not taking: Reported on 05/05/2014) 30 g 0  . pravastatin (PRAVACHOL) 20 MG tablet Take 20 mg by mouth daily.      . solifenacin (VESICARE) 5 MG tablet Take 1 tablet (5 mg total) by mouth daily. (Patient not taking: Reported on 05/05/2014) 90 tablet 3   No facility-administered medications prior to visit.    PAST MEDICAL HISTORY: Past Medical History  Diagnosis Date  . MS (multiple sclerosis)   . OAB (overactive bladder)   . Hypertension   . Hyperlipidemia   . Diabetes mellitus     TYPE 11  . Depression   . Memory loss   . Vision abnormalities     PAST SURGICAL HISTORY: Past Surgical History  Procedure Laterality Date  . Colonoscopy w/ polypectomy  2005  . Cataract extraction      FAMILY  HISTORY: Family History  Problem Relation Age of Onset  . Diabetes Mother   . Hypertension Mother   . Cancer Mother     COLON  . Breast cancer Mother   . Diabetes Maternal Grandmother   . Diabetes Paternal Grandmother   . Cancer Father     PROSTATE AND LUNG    SOCIAL HISTORY:  History   Social History  . Marital Status: Widowed    Spouse Name: N/A    Number of Children: N/A  . Years of Education: N/A   Occupational History  . Not on file.   Social History Main Topics  . Smoking status: Former Smoker -- 20 years    Types: Cigarettes  Quit date: 02/28/1988  . Smokeless tobacco: Never Used  . Alcohol Use: No     Comment: rare  . Drug Use: No  . Sexual Activity: No     Comment: 1st intercourse- 17, partners- 2   Other Topics Concern  . Not on file   Social History Narrative     PHYSICAL EXAM  Filed Vitals:   05/05/14 0943  BP: 146/70  Pulse: 68  Resp: 14  Height:  (1.626 m)  Weight: 173 lb 3.2 oz (78.563 kg)    Body mass index is 29.72 kg/(m^2).   General: The patient is well-developed and well-nourished and in no acute distress  Eyes:  Funduscopic exam shows normal optic discs and retinal vessels.  Neck: The neck is supple, no carotid bruits are noted.  The neck is nontender.  Respiratory: The respiratory examination is clear.  Cardiovascular: The cardiovascular examination reveals a regular rate and rhythm, no murmurs, gallops or rubs are noted.  Skin: Extremities are without significant edema.  Neurologic Exam  Mental status: The patient is alert and oriented x 3 at the time of the examination. The patient has apparent normal recent (2/3 without prompt; 3/3 with prompt) and remote memory, with an apparently normal attention span and concentration ability (WORLD-DLROW; 100-???; 20-???).   Clock is spaced well and hands correct.   Pattern continuation is ok.   Speech is normal.  Cranial nerves: Extraocular movements are full. Pupils are  equal, round, and reactive to light and accomodation.  Visual fields are full.  Facial symmetry is present. There is good facial sensation to soft touch bilaterally.Facial strength is normal.  Trapezius and sternocleidomastoid strength is normal. No dysarthria is noted.  The tongue is midline, and the patient has symmetric elevation of the soft palate. No obvious hearing deficits are noted.  Motor:  Muscle bulk is normal and tone is slightly increased in legs. Strength is  5 / 5 in all 4 extremities.   Sensory: Sensory testing is intact to pinprick, soft touch, vibration sensation, and position sense on all 4 extremities.  Coordination: Cerebellar testing reveals good finger-nose-finger and heel-to-shin bilaterally.  Gait and station: Station is normal with eyes open and closed.    Gait is wide stanced with shorter stride but she turns easily.   Tandem gait is poor and she needs a wider stance.   Romberg is negative.   Reflexes: Deep tendon reflexes are symmetric and brisk bilaterally. Plantar responses are normal.    DIAGNOSTIC DATA (LABS, IMAGING, TESTING) - I reviewed patient records, labs, notes, testing and imaging myself where available.      ASSESSMENT AND PLAN   Cognitive impairment  Multiple sclerosis  Depression  Ataxic gait  Chronic fatigue  Snoring   In summary, Elaine Wells is a 74 year old woman who was diagnosed with multiple sclerosis around 20 years ago. She has not been on any disease modifying therapy for about 6 years. She had difficulties with some aspects of her history and I am not certain of all of the dates. We will try to obtain records from [facility] and also get her last MRI that was performed about 6 or 7 years ago that we can compare it with a more recent one. If there are many changes on MRI of the brain during the 6 years, then I believe we should place her back on a disease modifying therapy. However, if the MRI is stable then we can leave her off  of such agent  and I would also have to reconsider the diagnosis.  Urinary symptoms but did not want to go back on a medicine for her bladder at this point. We also discussed possible medications for her cognition and fatigue, specifically a stimulant which might help both of these. However, this point she would prefer not to go on another medicine but would reconsider in the future. She will continue on the Aricept. Depending on the results of the MRI, I would also consider obtaining formal neurocognitive testing to better differentiate between possible causes of mild cognitive impairment.  50 minute face-to-face visit with greater than 50% of the time counseling and coordinating care about multiple sclerosis and her many symptoms.   Dysen Edmondson A. Epimenio Foot, MD, PhD 05/05/2014, 10:03 AM Certified in Neurology, Clinical Neurophysiology, Sleep Medicine, Pain Medicine and Neuroimaging  [facility] [address], [address] [place], [place] [address] [phone]

## 2014-05-07 ENCOUNTER — Ambulatory Visit: Payer: Medicare Other | Admitting: Neurology

## 2014-05-12 ENCOUNTER — Telehealth: Payer: Self-pay | Admitting: Neurology

## 2014-05-12 ENCOUNTER — Other Ambulatory Visit: Payer: Self-pay | Admitting: Neurology

## 2014-05-12 DIAGNOSIS — I10 Essential (primary) hypertension: Secondary | ICD-10-CM

## 2014-05-12 DIAGNOSIS — E119 Type 2 diabetes mellitus without complications: Secondary | ICD-10-CM

## 2014-05-12 NOTE — Telephone Encounter (Signed)
patient needs labs for BUN and Crea before MRI scan on 05/16/14.

## 2014-05-12 NOTE — Telephone Encounter (Signed)
Lets check BMP (can be done closer to home if needed)

## 2014-05-13 ENCOUNTER — Telehealth: Payer: Self-pay | Admitting: *Deleted

## 2014-05-13 NOTE — Telephone Encounter (Signed)
Order for bmp with gfr faxed to [facility] at [phone].  LMOM for [place] to call an I will explain the need for labs in order to ensure proper renal function prior to contrasted mri./fim

## 2014-05-13 NOTE — Telephone Encounter (Signed)
Spoke with Elaine Wells and advised her of the need for bmp.  She is agreeable, will go to the lab at the [facility] building tomorrow/fim

## 2014-05-13 NOTE — Telephone Encounter (Signed)
duplicate task/fim 

## 2014-05-14 ENCOUNTER — Telehealth: Payer: Self-pay | Admitting: Neurology

## 2014-05-14 NOTE — Telephone Encounter (Signed)
error 

## 2014-05-14 NOTE — Telephone Encounter (Signed)
Patient stated she doesn't know where to go for lab work.  Advised to go to [facility].but patient didn't know where that was located.  Please call and advise.

## 2014-06-10 ENCOUNTER — Other Ambulatory Visit: Payer: Self-pay | Admitting: Dermatology

## 2014-08-05 ENCOUNTER — Ambulatory Visit (INDEPENDENT_AMBULATORY_CARE_PROVIDER_SITE_OTHER): Payer: Medicare Other | Admitting: Neurology

## 2014-08-05 ENCOUNTER — Encounter: Payer: Self-pay | Admitting: Neurology

## 2014-08-05 VITALS — BP 140/58 | HR 62 | Resp 16 | Ht 64.0 in | Wt 170.8 lb

## 2014-08-05 DIAGNOSIS — R0683 Snoring: Secondary | ICD-10-CM | POA: Diagnosis not present

## 2014-08-05 DIAGNOSIS — R5382 Chronic fatigue, unspecified: Secondary | ICD-10-CM

## 2014-08-05 DIAGNOSIS — G35 Multiple sclerosis: Secondary | ICD-10-CM | POA: Diagnosis not present

## 2014-08-05 DIAGNOSIS — R350 Frequency of micturition: Secondary | ICD-10-CM | POA: Diagnosis not present

## 2014-08-05 DIAGNOSIS — G4700 Insomnia, unspecified: Secondary | ICD-10-CM | POA: Diagnosis not present

## 2014-08-05 DIAGNOSIS — R4189 Other symptoms and signs involving cognitive functions and awareness: Secondary | ICD-10-CM | POA: Diagnosis not present

## 2014-08-05 DIAGNOSIS — R260 Ataxic gait: Secondary | ICD-10-CM | POA: Diagnosis not present

## 2014-08-05 MED ORDER — SOLIFENACIN SUCCINATE 5 MG PO TABS: 5.0000 mg | ORAL_TABLET | Freq: Every day | ORAL | Status: DC

## 2014-08-05 NOTE — Progress Notes (Signed)
[facility]  PATIENT: Elaine Wells DOB: 08-11-1940  REFERRING CLINICIAN: Cyndy Freeze  HISTORY FROM: Patient  REASON FOR VISIT: MS   HISTORICAL  CHIEF COMPLAINT:  Chief Complaint  Patient presents with  . Multiple Sclerosis    Sts. memory is still bad--not sure if it is worse.  Sts. she had mri brain about 2 mos. ago but has not received a call about the results./fim    HISTORY OF PRESENT ILLNESS:  Elaine Wells is a 74 year old woman who was diagnosed with MS about 20 years ago. She has been off medications x 4 years.   At her last visit, she was noting a lot more cognitive issues but she feels she is doing better again.    Bladder and sleep issues are bothering her more this visit than last visit.   MS History:   About 22-23 years ago,  She had numbness in the left arm and also other episodes of numbness in the legs. However, an MRI was not done when she presented at that time and since she was having a lot of stress it was felt that there could've been psychologic reasons to her numbness.  A few years later , she had an MRI and was diagnosed with MS. She was initially placed on Betaseron but has not been on Betaseron or other disease modifying therapy for the past 6 years or so. She was seeing Dr. Leotis Shames until he moved to [place].    She continued to be seen at [facility] and was seen Dr. Renne Crigler.   She reports being told that her MS lesions were "dried up" and that she did not need to be on a medication.  Cognition/Mood:   She has noticed decreased memory the past year that was worse earlier this year.  She feels her short-term memory is worse than the long-term memory. She also has difficulty coming up with the right words. She has difficulty focusing and doing certain cognitive tasks. She finds driving is more difficult and she is more concerned about driving when there is a lot of traffic. She feels her memory has improved some since taking Aricept.    She denies  depression but notes anxiety, especially with driving  Gait/strength/sensation:   She notes that her gait is little worse and that she no longer has endurance with her walking.   Her legs do not feel weak. However, she has a lot of aching in her legs. She feels her balance is poor. She stumbles somewhat used to fall more years ago. She denies any numbness in the legs.  Vision:  She never had any MS related vision problems.   She feels vision improved after she has had cataract surgery. She has not had an episode of optic neuritis or diplopia.  Bladder:   She notes urinary frequency and urgency and occasional incontinence. She does not have difficulty starting and she empties her bladder well. She was on Vesicare. It helped her bladder, but she stopped it because she wanted to simplify her medical regimen.  She has rare bowel incontinence, often when she thinks she just needs to pass gas.     Fatigue/sleep:   She has a lot of physical fatigue that is worse as the day goes on.  Also has more fatigue with heat.The cognitive fatigue is worse than the physical fatigue. She has never taken Provigil or a stimulant. She sleeps poor;ly some night. She falls asleep easily but wakes up a lot.  She snores but nobody has ever told her that she gasps or stops breathing at night.   She does not have excessive daytime sleepiness.  REVIEW OF SYSTEMS:  Constitutional: No fevers, chills, sweats, or change in appetite.  She has fatigued and poor sleep Eyes: No visual changes, double vision, eye pain Ear, nose and throat: No hearing loss, ear pain, nasal congestion, sore throat Cardiovascular: No chest pain, palpitations Respiratory:  No shortness of breath at rest or with exertion.   No wheezes.   She snores GastrointestinaI: No nausea, vomiting, diarrhea, abdominal pain, fecal incontinence Genitourinary:  see above.    Musculoskeletal:  No neck pain, back pain.   Notes knee pain Integumentary: No rash, pruritus,  skin lesions Neurological: as above Psychiatric: Mild depression and anxiety Endocrine: No palpitations, diaphoresis, change in appetite, change in weigh or increased thirst Hematologic/Lymphatic:  No anemia, purpura, petechiae. Allergic/Immunologic: No itchy/runny eyes, nasal congestion, recent allergic reactions, rashes  ALLERGIES: No Known Allergies  HOME MEDICATIONS: Outpatient Prescriptions Prior to Visit  Medication Sig Dispense Refill  . amLODipine (NORVASC) 10 MG tablet Take 10 mg by mouth daily.    Marland Kitchen aspirin 81 MG tablet Take 81 mg by mouth daily.      Marland Kitchen atenolol (TENORMIN) 50 MG tablet Take 50 mg by mouth daily.      Marland Kitchen donepezil (ARICEPT) 5 MG tablet Take 5 mg by mouth at bedtime.    Marland Kitchen FLUoxetine (PROZAC) 20 MG capsule Take 20 mg by mouth daily.      Marland Kitchen lisinopril (PRINIVIL,ZESTRIL) 20 MG tablet     . pravastatin (PRAVACHOL) 20 MG tablet Take 20 mg by mouth daily.      . cholecalciferol (VITAMIN D) 1000 UNITS tablet Take 1,000 Units by mouth daily.     Marland Kitchen estradiol (ESTRACE) 0.1 MG/GM vaginal cream Place 0.25 Applicatorfuls vaginally 2 (two) times a week. (Patient not taking: Reported on 05/05/2014) 42.5 g 8  . hydrocortisone (PROCTOSOL HC) 2.5 % rectal cream Place 1 application rectally 2 (two) times daily. (Patient not taking: Reported on 05/05/2014) 30 g 3  . Multiple Vitamin (MULTIVITAMIN) tablet Take 1 tablet by mouth daily.      Marland Kitchen nystatin cream (MYCOSTATIN) APPLY 1 APPLICATION TOPICALLY 2 (TWO) TIMES DAILY. (Patient not taking: Reported on 05/05/2014) 30 g 0  . solifenacin (VESICARE) 5 MG tablet Take 1 tablet (5 mg total) by mouth daily. (Patient not taking: Reported on 05/05/2014) 90 tablet 3  . vitamin B-12 (CYANOCOBALAMIN) 100 MCG tablet Take 100 mcg by mouth daily.    . vitamin E 600 UNIT capsule Take 600 Units by mouth daily.     No facility-administered medications prior to visit.    PAST MEDICAL HISTORY: Past Medical History  Diagnosis Date  . MS (multiple sclerosis)    . OAB (overactive bladder)   . Hypertension   . Hyperlipidemia   . Diabetes mellitus     TYPE 11  . Depression   . Memory loss   . Vision abnormalities     PAST SURGICAL HISTORY: Past Surgical History  Procedure Laterality Date  . Colonoscopy w/ polypectomy  2005  . Cataract extraction      FAMILY HISTORY: Family History  Problem Relation Age of Onset  . Diabetes Mother   . Hypertension Mother   . Cancer Mother     COLON  . Breast cancer Mother   . Diabetes Maternal Grandmother   . Diabetes Paternal Grandmother   . Cancer Father  PROSTATE AND LUNG    SOCIAL HISTORY:  History   Social History  . Marital Status: Widowed    Spouse Name: N/A  . Number of Children: N/A  . Years of Education: N/A   Occupational History  . Not on file.   Social History Main Topics  . Smoking status: Former Smoker -- 20 years    Types: Cigarettes    Quit date: 02/28/1988  . Smokeless tobacco: Never Used  . Alcohol Use: No     Comment: rare  . Drug Use: No  . Sexual Activity: No     Comment: 1st intercourse- 17, partners- 2   Other Topics Concern  . Not on file   Social History Narrative     PHYSICAL EXAM  Filed Vitals:   08/05/14 1426  BP: 140/58  Pulse: 62  Resp: 16  Height:  (1.626 m)  Weight: 170 lb 12.8 oz (77.474 kg)    Body mass index is 29.3 kg/(m^2).   General: The patient is well-developed and well-nourished and in no acute distress  Neurologic Exam  Mental status: The patient is alert and oriented x 3 at the time of the examination. The patient has apparent normal recent (2/3 without prompt; 3/3 with prompt) and remote memory, with an apparently normal attention span and concentration ability [facility]).     Speech is normal.  Cranial nerves: Extraocular movements are full.   Facial symmetry is present. There is good facial sensation to soft touch bilaterally.Facial strength is normal.  Trapezius and sternocleidomastoid strength is  normal. No dysarthria is noted.  The tongue is midline, and the patient has symmetric elevation of the soft palate. No obvious hearing deficits are noted.  Motor:  Muscle bulk is normal and tone is slightly increased in legs. Strength is  5 / 5 in all 4 extremities.   Sensory: Sensory testing is intact to pinprick, soft touch, vibration sensation, and position sense on all 4 extremities.  Coordination: Cerebellar testing reveals good finger-nose-finger and mildly reduced heel-to-shin bilaterally.  Gait and station: Station is normal with eyes open and closed.    Gait is wide stanced and arthriticwith shorter stride.   Tandem gait is poor and she needs a wider stance.   Romberg is negative.   Reflexes: Deep tendon reflexes are symmetric and brisk bilaterally.     DIAGNOSTIC DATA (LABS, IMAGING, TESTING) - I reviewed patient records, labs, notes, testing and imaging myself where available.      ASSESSMENT AND PLAN   Multiple sclerosis  Ataxic gait  Chronic fatigue  Snoring  Urinary frequency  Insomnia  Cognitive impairment    1.   Resume Vesicare 2.  Start Melatonin 5 mg nightly (abpout 90 minutes before bedtime) 3.   We discussed symptoms of an exacerbation and to call us right away if she feels she is having one. 4.  She will return to see [place] in about 6 months but call sooner if she has new or worsening neurologic symptoms.   Marybel Alcott A. Epimenio Foot, MD, PhD 08/05/2014, 3:08 PM Certified in Neurology, Clinical Neurophysiology, Sleep Medicine, Pain Medicine and Neuroimaging  [facility] [address] [place], [place] [address] [phone]

## 2014-10-14 DIAGNOSIS — Z96659 Presence of unspecified artificial knee joint: Secondary | ICD-10-CM | POA: Insufficient documentation

## 2014-10-20 ENCOUNTER — Non-Acute Institutional Stay (SKILLED_NURSING_FACILITY): Payer: Medicare Other | Admitting: Internal Medicine

## 2014-10-20 ENCOUNTER — Encounter: Payer: Self-pay | Admitting: *Deleted

## 2014-10-20 DIAGNOSIS — G4700 Insomnia, unspecified: Secondary | ICD-10-CM | POA: Diagnosis not present

## 2014-10-20 DIAGNOSIS — K5901 Slow transit constipation: Secondary | ICD-10-CM | POA: Diagnosis not present

## 2014-10-20 DIAGNOSIS — E785 Hyperlipidemia, unspecified: Secondary | ICD-10-CM | POA: Diagnosis not present

## 2014-10-20 DIAGNOSIS — R4189 Other symptoms and signs involving cognitive functions and awareness: Secondary | ICD-10-CM | POA: Diagnosis not present

## 2014-10-20 DIAGNOSIS — I10 Essential (primary) hypertension: Secondary | ICD-10-CM | POA: Diagnosis not present

## 2014-10-20 DIAGNOSIS — M1712 Unilateral primary osteoarthritis, left knee: Secondary | ICD-10-CM

## 2014-10-20 DIAGNOSIS — R350 Frequency of micturition: Secondary | ICD-10-CM | POA: Diagnosis not present

## 2014-10-20 DIAGNOSIS — D62 Acute posthemorrhagic anemia: Secondary | ICD-10-CM

## 2014-10-20 DIAGNOSIS — F322 Major depressive disorder, single episode, severe without psychotic features: Secondary | ICD-10-CM

## 2014-10-20 DIAGNOSIS — F329 Major depressive disorder, single episode, unspecified: Secondary | ICD-10-CM | POA: Insufficient documentation

## 2014-10-20 NOTE — Progress Notes (Signed)
Patient ID: Elaine Wells, female   DOB: 05/05/1940, 74 y.o.   MRN: [id number]      [facility]   PCP: Carollee Massed, MD  Code Status: full code  No Known Allergies  Chief Complaint  Patient presents with  . New Admit To SNF     HPI:  74 y.o. patient is here for short term rehabilitation post hospital admission from 10/14/14-10/17/14 with left knee OA. She underwent left total knee arthroplasty. Her pain is currenlty under control with current pain regimen. She has muscle spasm.she also has been constipated. Denies any other concerns  Review of Systems:  Constitutional: Negative for fever, chills, diaphoresis.  HENT: Negative for headache, congestion, nasal discharge Eyes: Negative for eye pain, blurred vision, double vision and discharge.  Respiratory: Negative for cough, shortness of breath and wheezing.   Cardiovascular: Negative for chest pain, palpitations, leg swelling.  Gastrointestinal: Negative for heartburn, nausea, vomiting, abdominal pain. Last bowel movement 2 days back Genitourinary: Negative for dysuria,flank pain.  Musculoskeletal: Negative for back pain, falls Skin: Negative for itching, rash.  Neurological: Negative for dizziness, tingling, focal weakness Psychiatric/Behavioral: Negative for depression   Past Medical History  Diagnosis Date  . MS (multiple sclerosis)   . OAB (overactive bladder)   . Hypertension   . Hyperlipidemia   . Diabetes mellitus     TYPE 2  . Depression   . Memory loss   . Vision abnormalities   . Osteoarthritis of left knee    Past Surgical History  Procedure Laterality Date  . Colonoscopy w/ polypectomy  2005  . Cataract extraction     Social History:   reports that she quit smoking about 26 years ago. Her smoking use included Cigarettes. She quit after 20 years of use. She has never used smokeless tobacco. She reports that she does not drink alcohol or use illicit drugs.  Family  History  Problem Relation Age of Onset  . Diabetes Mother   . Hypertension Mother   . Cancer Mother     COLON  . Breast cancer Mother   . Diabetes Maternal Grandmother   . Diabetes Paternal Grandmother   . Cancer Father     PROSTATE AND LUNG    Medications:   Medication List       This list is accurate as of: 10/20/14  4:44 PM.  Always use your most recent med list.               acetaminophen 650 MG CR tablet  Commonly known as:  TYLENOL  Take 650 mg by mouth every 8 (eight) hours as needed for pain.     amLODipine 10 MG tablet  Commonly known as:  NORVASC  Take 10 mg by mouth daily. For HTN     b complex vitamins capsule  Take 1 capsule by mouth daily.     carvedilol 25 MG tablet  Commonly known as:  COREG  Take 25 mg by mouth 2 (two) times daily with a meal.     cyclobenzaprine 5 MG tablet  Commonly known as:  FLEXERIL  Take 5 mg by mouth 3 (three) times daily as needed for muscle spasms.     donepezil 5 MG tablet  Commonly known as:  ARICEPT  Take 5 mg by mouth at bedtime. For Alzheimer disease     FLUoxetine 20 MG capsule  Commonly known as:  PROZAC  Take 20 mg by mouth daily. For depression  Glucosamine 750 MG Tabs  Take 1 tablet by mouth daily.     HYDROcodone-acetaminophen 10-325 MG per tablet  Commonly known as:  NORCO  1/2 tablet every 4 hours as needed for pain on a scale of 3-5.     HYDROcodone-acetaminophen 10-325 MG per tablet  Commonly known as:  NORCO  Take 1 tablet by mouth every 4 (four) hours as needed. For pain     lisinopril 20 MG tablet  Commonly known as:  PRINIVIL,ZESTRIL  Take 20 mg by mouth daily. For HTN     Magnesium 250 MG Tabs  Take 1 tablet by mouth daily.     Melatonin 5 MG Caps  Take 1 capsule by mouth as needed.     multivitamin tablet  Take 1 tablet by mouth daily. supplement     OMEGA DHA PO  Take by mouth. 300/1000 mg capsule, take 1200 by mouth daily.     pravastatin 20 MG tablet  Commonly known as:   PRAVACHOL  Take 40 mg by mouth daily.     rivaroxaban 10 MG Tabs tablet  Commonly known as:  XARELTO  Take 10 mg by mouth daily.     solifenacin 5 MG tablet  Commonly known as:  VESICARE  Take 1 tablet (5 mg total) by mouth daily.         Physical Exam: Filed Vitals:   10/20/14 1446  BP: 148/75  Pulse: 88  Temp: 98.9 F (37.2 C)  Resp: 18  SpO2: 98%    General- elderly female, well built, in no acute distress Head- normocephalic, atraumatic Nose- no maxillary or frontal sinus tenderness Throat- moist mucus membrane Eyes- no pallor, no icterus, no discharge, normal conjunctiva, normal sclera Neck- no cervical lymphadenopathy Cardiovascular- normal s1,s2, no murmurs, palpable dorsalis pedis and radial pulses, trace leg edema Respiratory- bilateral clear to auscultation, no wheeze, no rhonchi, no crackles, no use of accessory muscles Abdomen- bowel sounds present, soft, non tender Musculoskeletal- able to move all 4 extremities, left knee ROM limited Neurological- no focal deficit, alert and oriented to person, place and time Skin- warm and dry, left knee surgical incision with staples in place, healing well with dressign clean and dry Psychiatry- normal mood and affect    Labs reviewed: 10/14/14 wbc 8.4, hb 9.9, hct 30.5, plt 146, na 138, k 4.4, bun 20, cr 0.78, ca 8.3  Assessment/Plan  Left knee OA S/p left total knee arthroplasty. Will have him work with physical therapy and occupational therapy team to help with gait training and muscle strengthening exercises.fall precautions. Skin care. Encourage to be out of bed. Continue norco 10-325 half to 1 tab q4h prn pain. Change flexeril to 5 mg bid with additional 5 mg in afternoon as needed and reassess. Continue xarelto for dvt prophylaxis. Add ted hose  Blood loss anemia Post op, monitor h&h  Constipation Add colace 100 mg bid with miralax daily for 3 days, then daily prn, hydration encouraged  HTN Stable,  continue amlodipine 10 mg daily, lisinopril 20 mg daily with coreg 25 mg bid and check bmp  Cognitive impairment At her baseline, alert, Continue aricept 5 mg daily and monitor  Depression with dementia Continue fluoxetine 20 mg daily  Insomnia Continue melatonin 5 mg qhs prn   HLD Continue pravastatin 40 mg daily  Urinary frequency Continue vesicare 5 mg daily    Goals of care: short term rehabilitation   Labs/tests ordered: cbc, bmp next lab  Family/ staff Communication: reviewed care plan  with patient and nursing supervisor    Oneal GroutMAHIMA Teandra Harlan, MD  [facility] [phone] (Monday-Friday 8 am - 5 pm) [phone] (afterhours)

## 2014-10-28 ENCOUNTER — Encounter: Payer: Self-pay | Admitting: Adult Health

## 2014-10-28 ENCOUNTER — Non-Acute Institutional Stay (SKILLED_NURSING_FACILITY): Payer: Medicare Other | Admitting: Adult Health

## 2014-10-28 DIAGNOSIS — F322 Major depressive disorder, single episode, severe without psychotic features: Secondary | ICD-10-CM | POA: Diagnosis not present

## 2014-10-28 DIAGNOSIS — G4700 Insomnia, unspecified: Secondary | ICD-10-CM

## 2014-10-28 DIAGNOSIS — R350 Frequency of micturition: Secondary | ICD-10-CM | POA: Diagnosis not present

## 2014-10-28 DIAGNOSIS — R4189 Other symptoms and signs involving cognitive functions and awareness: Secondary | ICD-10-CM | POA: Diagnosis not present

## 2014-10-28 DIAGNOSIS — I10 Essential (primary) hypertension: Secondary | ICD-10-CM

## 2014-10-28 DIAGNOSIS — M1712 Unilateral primary osteoarthritis, left knee: Secondary | ICD-10-CM

## 2014-10-28 DIAGNOSIS — K5901 Slow transit constipation: Secondary | ICD-10-CM

## 2014-10-28 DIAGNOSIS — F329 Major depressive disorder, single episode, unspecified: Secondary | ICD-10-CM

## 2014-10-28 DIAGNOSIS — D62 Acute posthemorrhagic anemia: Secondary | ICD-10-CM

## 2014-10-28 DIAGNOSIS — E785 Hyperlipidemia, unspecified: Secondary | ICD-10-CM | POA: Diagnosis not present

## 2014-11-27 HISTORY — PX: REPLACEMENT TOTAL KNEE: SUR1224

## 2014-12-04 ENCOUNTER — Other Ambulatory Visit: Payer: Self-pay | Admitting: Nurse Practitioner

## 2014-12-13 ENCOUNTER — Other Ambulatory Visit: Payer: Self-pay | Admitting: Nurse Practitioner

## 2015-01-19 ENCOUNTER — Other Ambulatory Visit: Payer: Self-pay | Admitting: Nurse Practitioner

## 2015-01-25 NOTE — Progress Notes (Signed)
Patient ID: Elaine Wells, female   DOB: 1940-09-22, 74 y.o.   MRN: [id number]    DATE:  10/28/14  MRN:  [id number]  BIRTHDAY: 02/05/1941  Facility:  Nursing Home Location:  [facility]  Nursing Home Room Number: [id number]  LEVEL OF CARE:  SNF (31)  Contact Information    Name Relation Home Work Mobile   [place] Other [phone]     No name specified           Chief Complaint  Patient presents with  . Discharge Note    Osteoarthritis S/P left total knee arthroplasty, anemia, hypertension, cognitive impairment, insomnia, hyperlipidemia, urinary frequency, depression and constipation    HISTORY OF PRESENT ILLNESS:  This is a 74 year old female who is for discharge home with home health PT for endurance, OT for ADLs and CNA for showers. DME:  Rolling walker. She has been admitted to [facility] on 10/17/14 from [facility]. She has left knee osteoarthritis for which she had left total knee arthroplasty.  Patient was admitted to this facility for short-term rehabilitation after the patient's recent hospitalization.  Patient has completed SNF rehabilitation and therapy has cleared the patient for discharge.   PAST MEDICAL HISTORY:  Past Medical History  Diagnosis Date  . MS (multiple sclerosis)   . OAB (overactive bladder)   . Hypertension   . Hyperlipidemia   . Diabetes mellitus     TYPE 2  . Depression   . Memory loss   . Vision abnormalities   . Osteoarthritis of left knee      CURRENT MEDICATIONS: Reviewed  Patient's Medications  New Prescriptions   No medications on file  Previous Medications   ACETAMINOPHEN (TYLENOL) 650 MG CR TABLET    Take 650 mg by mouth every 8 (eight) hours as needed for pain.   AMLODIPINE (NORVASC) 10 MG TABLET    Take 10 mg by mouth daily. For HTN   B COMPLEX VITAMINS CAPSULE    Take 1 capsule by mouth daily.   CARVEDILOL (COREG) 25 MG TABLET    Take 25 mg by mouth 2 (two) times  daily with a meal.   CYCLOBENZAPRINE (FLEXERIL) 5 MG TABLET    Take 5 mg by mouth 3 (three) times daily as needed for muscle spasms.   DONEPEZIL (ARICEPT) 5 MG TABLET    Take 5 mg by mouth at bedtime. For Alzheimer disease   FLUOXETINE (PROZAC) 20 MG CAPSULE    Take 20 mg by mouth daily. For depression   GLUCOSAMINE 750 MG TABS    Take 1 tablet by mouth daily.   HYDROCODONE-ACETAMINOPHEN (NORCO) 10-325 MG PER TABLET    1/2 tablet every 4 hours as needed for pain on a scale of 3-5.   HYDROCODONE-ACETAMINOPHEN (NORCO) 10-325 MG PER TABLET    Take 1 tablet by mouth every 4 (four) hours as needed. For pain   LISINOPRIL (PRINIVIL,ZESTRIL) 20 MG TABLET    Take 20 mg by mouth daily. For HTN   MAGNESIUM 250 MG TABS    Take 1 tablet by mouth daily.   MELATONIN 5 MG CAPS    Take 1 capsule by mouth as needed.    MULTIPLE VITAMIN (MULTIVITAMIN) TABLET    Take 1 tablet by mouth daily. supplement   OMEGA 3-6-9 FATTY ACIDS (OMEGA DHA PO)    Take by mouth. 300/1000 mg capsule, take 1200 by mouth daily.   PRAVASTATIN (PRAVACHOL) 20 MG TABLET  Take 40 mg by mouth daily.    RIVAROXABAN (XARELTO) 10 MG TABS TABLET    Take 10 mg by mouth daily.   SOLIFENACIN (VESICARE) 5 MG TABLET    Take 1 tablet (5 mg total) by mouth daily.  Modified Medications   No medications on file  Discontinued Medications   No medications on file     No Known Allergies   REVIEW OF SYSTEMS:  GENERAL: no change in appetite, no fatigue, no weight changes, no fever, chills or weakness EYES: Denies change in vision, dry eyes, eye pain, itching or discharge EARS: Denies change in hearing, ringing in ears, or earache NOSE: Denies nasal congestion or epistaxis MOUTH and THROAT: Denies oral discomfort, gingival pain or bleeding, pain from teeth or hoarseness   RESPIRATORY: no cough, SOB, DOE, wheezing, hemoptysis CARDIAC: no chest pain, edema or palpitations GI: no abdominal pain, diarrhea, constipation, heart burn, nausea or  vomiting GU: Denies dysuria, frequency, hematuria, incontinence, or discharge PSYCHIATRIC: Denies feeling of depression or anxiety. No report of hallucinations, insomnia, paranoia, or agitation   PHYSICAL EXAMINATION  GENERAL APPEARANCE: Well nourished. In no acute distress. Normal body habitus SKIN:  Left knee surgical incision has steri-strips, dry and no erythema HEAD: Normal in size and contour. No evidence of trauma EYES: Lids open and close normally. No blepharitis, entropion or ectropion. PERRL. Conjunctivae are clear and sclerae are white. Lenses are without opacity EARS: Pinnae are normal. Patient hears normal voice tunes of the examiner MOUTH and THROAT: Lips are without lesions. Oral mucosa is moist and without lesions. Tongue is normal in shape, size, and color and without lesions NECK: supple, trachea midline, no neck masses, no thyroid tenderness, no thyromegaly LYMPHATICS: no LAN in the neck, no supraclavicular LAN RESPIRATORY: breathing is even & unlabored, BS CTAB CARDIAC: RRR, no murmur,no extra heart sounds, no edema GI: abdomen soft, normal BS, no masses, no tenderness, no hepatomegaly, no splenomegaly EXTREMITIES:  Able to move 4 extremities PSYCHIATRIC: Alert and oriented X 3. Affect and behavior are appropriate  LABS/RADIOLOGY: Labs reviewed: 10/21/14  WBC 5.2 hemoglobin 8.9 hematocrit 27.0 MCV 83.3 platelet 209 sodium 138 potassium 4.7 glucose 113 BUN 14 creatinine 0.67 calcium 8.8 10/15/14  WBC 8.4 hemoglobin 9.9 hematocrit 30.5 MCV 86.0 platelet 146 sodium 138 potassium 4.4 glucose 113 BUN 20 creatinine 0.78 calcium 8.3 10/14/14 wbc 8.4, hb 9.9, hct 30.5, plt 146, na 138, k 4.4, bun 20, cr 0.78, ca 8.3   ASSESSMENT/PLAN:  Osteoarthritis S/P left total knee arthroplasty - for home health PT, OT and CNA; continue Flexeril 5 mg 1 by mouth twice a day and every afternoon when necessary for muscle spasm Norco 10/325 mg 1/2 - 1 tab by mouth every 4 hours when necessary  for pain; Xarelto 10 mg 1 tab by mouth daily  (last dose on 10/31/14)  Constipation - continue MiraLAX 17 g by mouth daily when necessary and Colace 100 mg by mouth twice a day  Anemia, acute blood loss - hemoglobin 8.9; stable  Hypertension - well-controlled; continue Norvasc 10 mg 1 tab by mouth daily, Coreg 25 mg 1 tab by mouth twice a day and lisinopril 20 mg 1 tab by mouth daily  Cognitive impairment - continue Aricept 5 mg 1 tab by mouth daily  Insomnia - continue melatonin 5 mg by mouth daily at bedtime when necessary  Hyperlipidemia - continue Lipitor 10 mg 1 tab by mouth daily  Urinary frequency - continue Vesicare 5 mg 1 tab by mouth  daily  Depression - mood this is stable; continue Prozac 20 mg 1 capsule by mouth daily     I have filled out patient's discharge paperwork and written prescriptions.  Patient will receive home health PT, OT and CNA.  DME provided:  Rolling walker  Total discharge time: Greater than 30 minutes  Discharge time involved coordination of the discharge process with Child psychotherapist, nursing staff and therapy department. Medical justification for home health services/DME verified.    [facility], NP [employer] [phone]

## 2015-02-05 ENCOUNTER — Encounter: Payer: Self-pay | Admitting: Neurology

## 2015-02-05 ENCOUNTER — Ambulatory Visit (INDEPENDENT_AMBULATORY_CARE_PROVIDER_SITE_OTHER): Payer: Medicare Other | Admitting: Neurology

## 2015-02-05 VITALS — BP 142/88 | HR 72 | Resp 16 | Ht 64.0 in | Wt 169.4 lb

## 2015-02-05 DIAGNOSIS — R0683 Snoring: Secondary | ICD-10-CM | POA: Diagnosis not present

## 2015-02-05 DIAGNOSIS — R260 Ataxic gait: Secondary | ICD-10-CM

## 2015-02-05 DIAGNOSIS — M25551 Pain in right hip: Secondary | ICD-10-CM

## 2015-02-05 DIAGNOSIS — R350 Frequency of micturition: Secondary | ICD-10-CM

## 2015-02-05 DIAGNOSIS — G35 Multiple sclerosis: Secondary | ICD-10-CM

## 2015-02-05 DIAGNOSIS — R5382 Chronic fatigue, unspecified: Secondary | ICD-10-CM | POA: Diagnosis not present

## 2015-02-05 NOTE — Progress Notes (Signed)
[facility]  PATIENT: Elaine Wells DOB: 1940/07/06  REFERRING CLINICIAN: Cyndy Freeze  HISTORY FROM: Patient  REASON FOR VISIT: MS   HISTORICAL  CHIEF COMPLAINT:  Chief Complaint  Patient presents with  . Multiple Sclerosis    Sts. bladder issues are improved since restarting Vesicare.  Sts. insomnia has resolved so she stopped Melatonin.  She is no longer taking Aricept and is not sure why--sts. she feels she needs to start it again.  Today has new c/o right hip pain off/on onset 3 mos. ago.  Denies known injury/fim    HISTORY OF PRESENT ILLNESS:  Elaine Wells is a 74 year old woman who was diagnosed with MS about 20 years ago. She has been off medications x 4 years.   At her last visit, she was noting a lot more cognitive issues but she feels she is doing better again.    Bladder and sleep issues are bothering her more this visit than last visit.   MS History:   About 22-23 years ago,  She had numbness in the left arm and also other episodes of numbness in the legs. However, an MRI was not done when she presented at that time and since she was having a lot of stress it was felt that there could've been psychologic reasons to her numbness.  A few years later , she had an MRI and was diagnosed with MS. She was initially placed on Betaseron but has not been on Betaseron or other disease modifying therapy for the past 6 years or so. She was seeing Dr. Leotis Shames until he moved to [place].    She continued to be seen at [facility] and was seen Dr. Renne Crigler.   She reports being told that her MS lesions were "dried up" and that she did not need to be on a medication.  Cognition/Mood:   She notes cognition worsened the past couple years, though is actually better now than earlier in year.  .    She feels short term memory is worse --- in the past, if she forgot something, she would remember after a hint but now she feels she never stored the memory.   She has much less difficulty  with long-term memory. She has difficulty coming up with the right words. She also has difficulty focusing and doing certain cognitive tasks. She is driving some but only short distances -- her son drives longer distances.   Although memory is a problem, she feels it is better than 6 months ago.    She stopped Aricept --- she thinks it may have helped at first but feels ok off it.     She stopped Aricept and fee   She denies depression but notes anxiety, especially with driving  Gait/strength/sensation:   She notes that her gait is mildly off but not worsening.   Her knee replacement went well and she is walking better since.    She feels her balance is mildly reduced.   No weakness or numbness.    Flonnie Hailstone otes some right > left hip pain.  Vision:  Vision is fine.   She never had any MS related vision problems.   She feels vision improved after she has had cataract surgery in the past  Bladder:   She notes urinary frequency and urgency are  bettter since resuming Vesicare.  She has nocturia only 1 x instead of 3-4.    She does not have hesitancy  and she empties her bladder well.  No bowel incontinence.  Fatigue/sleep:   She has physical fatigue that is worse as the day goes on.  This is worse with heat.  She sleeps better at night since getting back on Vesicare but sleeps 10-12 hours a night.   She falls asleep easily but wakes up a lot.  She snores but nobody has ever told her that she gasps or stops breathing at night.   She does not have excessive daytime sleepiness.  REVIEW OF SYSTEMS:  Constitutional: No fevers, chills, sweats, or change in appetite.  She has fatigued and poor sleep Eyes: No visual changes, double vision, eye pain Ear, nose and throat: No hearing loss, ear pain, nasal congestion, sore throat Cardiovascular: No chest pain, palpitations Respiratory:  No shortness of breath at rest or with exertion.   No wheezes.   She snores GastrointestinaI: No nausea, vomiting, diarrhea,  abdominal pain, fecal incontinence Genitourinary:  see above.    Musculoskeletal:  Notes R hip pain Integumentary: No rash, pruritus, skin lesions Neurological: as above Psychiatric: Mild depression and anxiety Endocrine: No palpitations, diaphoresis, change in appetite, change in weigh or increased thirst Hematologic/Lymphatic:  No anemia, purpura, petechiae. Allergic/Immunologic: No itchy/runny eyes, nasal congestion, recent allergic reactions, rashes  ALLERGIES: No Known Allergies  HOME MEDICATIONS: Outpatient Prescriptions Prior to Visit  Medication Sig Dispense Refill  . acetaminophen (TYLENOL) 650 MG CR tablet Take 650 mg by mouth every 8 (eight) hours as needed for pain.    Marland Kitchen amLODipine (NORVASC) 10 MG tablet Take 10 mg by mouth daily. For HTN    . b complex vitamins capsule Take 1 capsule by mouth daily.    . carvedilol (COREG) 25 MG tablet Take 25 mg by mouth 2 (two) times daily with a meal.    . cyclobenzaprine (FLEXERIL) 5 MG tablet Take 5 mg by mouth 3 (three) times daily as needed for muscle spasms.    Marland Kitchen FLUoxetine (PROZAC) 20 MG capsule Take 20 mg by mouth daily. For depression    . lisinopril (PRINIVIL,ZESTRIL) 20 MG tablet Take 20 mg by mouth daily. For HTN    . Multiple Vitamin (MULTIVITAMIN) tablet Take 1 tablet by mouth daily. supplement    . pravastatin (PRAVACHOL) 20 MG tablet Take 40 mg by mouth daily.     . solifenacin (VESICARE) 5 MG tablet Take 1 tablet (5 mg total) by mouth daily. (Patient taking differently: Take 5 mg by mouth daily. For urinary frequency) 90 tablet 3  . donepezil (ARICEPT) 5 MG tablet Take 5 mg by mouth at bedtime. For Alzheimer disease    . Glucosamine 750 MG TABS Take 1 tablet by mouth daily.    Marland Kitchen HYDROcodone-acetaminophen (NORCO) 10-325 MG per tablet 1/2 tablet every 4 hours as needed for pain on a scale of 3-5.    Marland Kitchen HYDROcodone-acetaminophen (NORCO) 10-325 MG per tablet Take 1 tablet by mouth every 4 (four) hours as needed. For pain    .  Magnesium 250 MG TABS Take 1 tablet by mouth daily.    . Melatonin 5 MG CAPS Take 1 capsule by mouth as needed.     . Omega 3-6-9 Fatty Acids (OMEGA DHA PO) Take by mouth. 300/1000 mg capsule, take 1200 by mouth daily.    . rivaroxaban (XARELTO) 10 MG TABS tablet Take 10 mg by mouth daily.     No facility-administered medications prior to visit.    PAST MEDICAL HISTORY: Past Medical History  Diagnosis Date  . MS (multiple sclerosis) (HCC)   .  OAB (overactive bladder)   . Hypertension   . Hyperlipidemia   . Diabetes mellitus     TYPE 2  . Depression   . Memory loss   . Vision abnormalities   . Osteoarthritis of left knee     PAST SURGICAL HISTORY: Past Surgical History  Procedure Laterality Date  . Colonoscopy w/ polypectomy  2005  . Cataract extraction      FAMILY HISTORY: Family History  Problem Relation Age of Onset  . Diabetes Mother   . Hypertension Mother   . Cancer Mother     COLON  . Breast cancer Mother   . Diabetes Maternal Grandmother   . Diabetes Paternal Grandmother   . Cancer Father     PROSTATE AND LUNG    SOCIAL HISTORY:  Social History   Social History  . Marital Status: Widowed    Spouse Name: N/A  . Number of Children: N/A  . Years of Education: N/A   Occupational History  . Not on file.   Social History Main Topics  . Smoking status: Former Smoker -- 20 years    Types: Cigarettes    Quit date: 02/28/1988  . Smokeless tobacco: Never Used  . Alcohol Use: No     Comment: rare  . Drug Use: No  . Sexual Activity: No     Comment: 1st intercourse- 17, partners- 2   Other Topics Concern  . Not on file   Social History Narrative     PHYSICAL EXAM  Filed Vitals:   02/05/15 1438  BP: 142/88  Pulse: 72  Resp: 16  Height: 5\' 4"  (1.626 m)  Weight: 169 lb 6.4 oz (76.839 kg)    Body mass index is 29.06 kg/(m^2).   General: The patient is well-developed and well-nourished and in no acute distress  Neurologic Exam  Mental  status: The patient is alert and oriented x 3 at the time of the examination. The patient has apparent normal recent (2/3 without prompt; 2/3 with prompt) and remote memory, with an apparently normal attention span and concentration ability [facility]).     Speech is normal.  Cranial nerves: Extraocular movements are full.   Facial symmetry is present. There is good facial sensation to soft touch bilaterally.Facial strength is normal.  Trapezius and sternocleidomastoid strength is normal. No dysarthria is noted.  The tongue is midline, and the patient has symmetric elevation of the soft palate. No obvious hearing deficits are noted.  Motor:  Muscle bulk is normal and tone is slightly increased in legs. Strength is  5 / 5 in all 4 extremities.   Sensory: Sensory testing is intact to pinprick, soft touch, vibration sensation, and position sense on all 4 extremities.  Coordination: Cerebellar testing reveals good finger-nose-finger and mildly reduced heel-to-shin bilaterally.  Gait and station: Station is normal with eyes open and closed.    Gait is wide stanced reduced with stride.   Tandem gait is poor and she needs a wider stance.   Romberg is negative.   Reflexes: Deep tendon reflexes are symmetric and brisk bilaterally.     DIAGNOSTIC DATA (LABS, IMAGING, TESTING) - I reviewed patient records, labs, notes, testing and imaging myself where available.      ASSESSMENT AND PLAN   MULTIPLE SCLEROSIS  Chronic fatigue  Snoring  Urinary frequency  Ataxic gait  Right hip pain    1.   Ok off any MS DMT as very stable.    Call us right away if she feels  she is having an exacerbation. 2.  Continue Vesicare  3.   OTC NSAID for right hip.   Stay active and exercise as tolerated    She will return to see [place] in about 6 months but call sooner if she has new or worsening neurologic symptoms.   Jayley Hustead A. Epimenio Foot, MD, PhD 02/05/2015, 2:41 PM Certified in Neurology, Clinical  Neurophysiology, Sleep Medicine, Pain Medicine and Neuroimaging  [facility] [address] [place], [place] [address] [phone]

## 2015-02-24 ENCOUNTER — Ambulatory Visit (INDEPENDENT_AMBULATORY_CARE_PROVIDER_SITE_OTHER): Payer: Medicare Other | Admitting: Women's Health

## 2015-02-24 DIAGNOSIS — R350 Frequency of micturition: Secondary | ICD-10-CM | POA: Diagnosis not present

## 2015-02-24 DIAGNOSIS — N898 Other specified noninflammatory disorders of vagina: Secondary | ICD-10-CM | POA: Diagnosis not present

## 2015-02-24 LAB — URINALYSIS W MICROSCOPIC + REFLEX CULTURE
Bilirubin Urine: NEGATIVE
CASTS: NONE SEEN [LPF]
Crystals: NONE SEEN [HPF]
GLUCOSE, UA: NEGATIVE
Hgb urine dipstick: NEGATIVE
Ketones, ur: NEGATIVE
Leukocytes, UA: NEGATIVE
NITRITE: NEGATIVE
PROTEIN: NEGATIVE
SPECIFIC GRAVITY, URINE: 1.025 (ref 1.001–1.035)
Yeast: NONE SEEN [HPF]
pH: 5 (ref 5.0–8.0)

## 2015-02-24 LAB — WET PREP FOR TRICH, YEAST, CLUE
Clue Cells Wet Prep HPF POC: NONE SEEN
Trich, Wet Prep: NONE SEEN
Yeast Wet Prep HPF POC: NONE SEEN

## 2015-02-24 NOTE — Progress Notes (Signed)
Patient ID: Elaine Wells, female   DOB: 23-Nov-1940, 74 y.o.   MRN: [id number] Presents with complaint of  2 weeks history of external vaginal itching with no discharge, used over-the-counter Vagisil with no relief. Continues to have problems with urinary incontinence having to change her underwear numerous times during the day. Does not wear pads states that causes more irritation. Currently on Vesicare takes once daily not twice, states fears the side effects. Also struggles with health problems of MS, dementia, hypertension, diabetes, hypercholesterolemia.  Exam: Appears well, responses slow/thinks to answer questions. No CVAT. Abdomen obese, soft nontender. External genitalia extremely erythematous excoriated from labias to rectum. Wet prep with a Q-tip negative. Leaking urine with exam. UA: UA negative, 0-5 WBCs, 0-2 RBCs, moderate bacteria  External skin excoriation from incontinence Numerous health problems  Plan: Encouraged A and D ointment twice daily, continue to change clothes frequently, Vesicare twice daily, urine culture pending.

## 2015-02-25 ENCOUNTER — Encounter: Payer: Self-pay | Admitting: Gynecology

## 2015-02-26 LAB — URINE CULTURE

## 2015-02-28 ENCOUNTER — Other Ambulatory Visit: Payer: Self-pay | Admitting: Nurse Practitioner

## 2015-03-21 ENCOUNTER — Other Ambulatory Visit: Payer: Self-pay | Admitting: Nurse Practitioner

## 2015-03-23 ENCOUNTER — Other Ambulatory Visit: Payer: Self-pay | Admitting: Nurse Practitioner

## 2015-04-16 ENCOUNTER — Encounter: Payer: Self-pay | Admitting: Gynecology

## 2015-04-16 ENCOUNTER — Ambulatory Visit (INDEPENDENT_AMBULATORY_CARE_PROVIDER_SITE_OTHER): Payer: Medicare Other | Admitting: Gynecology

## 2015-04-16 VITALS — BP 140/84 | Ht 62.75 in | Wt 177.0 lb

## 2015-04-16 DIAGNOSIS — N952 Postmenopausal atrophic vaginitis: Secondary | ICD-10-CM

## 2015-04-16 DIAGNOSIS — M8580 Other specified disorders of bone density and structure, unspecified site: Secondary | ICD-10-CM

## 2015-04-16 DIAGNOSIS — Z01419 Encounter for gynecological examination (general) (routine) without abnormal findings: Secondary | ICD-10-CM

## 2015-04-16 DIAGNOSIS — L304 Erythema intertrigo: Secondary | ICD-10-CM

## 2015-04-16 DIAGNOSIS — Z86010 Personal history of colonic polyps: Secondary | ICD-10-CM

## 2015-04-16 MED ORDER — NYSTATIN-TRIAMCINOLONE 100000-0.1 UNIT/GM-% EX CREA: TOPICAL_CREAM | CUTANEOUS | Status: DC

## 2015-04-16 NOTE — Patient Instructions (Addendum)
Bone Densitometry Bone densitometry is an imaging test that uses a special X-ray to measure the amount of calcium and other minerals in your bones (bone density). This test is also known as a bone mineral density test or dual-energy X-ray absorptiometry (DXA). The test can measure bone density at your hip and your spine. It is similar to having a regular X-ray. You may have this test to:  Diagnose a condition that causes weak or thin bones (osteoporosis).  Predict your risk of a broken bone (fracture).  Determine how well osteoporosis treatment is working. LET [facility] CARE PROVIDER KNOW ABOUT:  Any allergies you have.  All medicines you are taking, including vitamins, herbs, eye drops, creams, and over-the-counter medicines.  Previous problems you or members of your family have had with the use of anesthetics.  Any blood disorders you have.  Previous surgeries you have had.  Medical conditions you have.  Possibility of pregnancy.  Any other medical test you had within the previous 14 days that used contrast material. RISKS AND COMPLICATIONS Generally, this is a safe procedure. However, problems can occur and may include the following:  This test exposes you to a very small amount of radiation.  The risks of radiation exposure may be greater to unborn children. BEFORE THE PROCEDURE  Do not take any calcium supplements for 24 hours before having the test. You can otherwise eat and drink what you usually do.  Take off all metal jewelry, eyeglasses, dental appliances, and any other metal objects. PROCEDURE  You may lie on an exam table. There will be an X-ray generator below you and an imaging device above you.  Other devices, such as boxes or braces, may be used to position your body properly for the scan.  You will need to lie still while the machine slowly scans your body.  The images will show up on a computer monitor. AFTER THE PROCEDURE You may need more testing  at a later time.   This information is not intended to replace advice given to you by your health care provider. Make sure you discuss any questions you have with your health care provider.   Document Released: 04/05/2004 Document Revised: 04/04/2014 Document Reviewed: 08/22/2013 Elsevier Interactive Patient Education 2016 [employer]. Colonoscopy A colonoscopy is an exam to look at the entire large intestine (colon). This exam can help find problems such as tumors, polyps, inflammation, and areas of bleeding. The exam takes about 1 hour.  LET [facility] CARE PROVIDER KNOW ABOUT:   Any allergies you have.  All medicines you are taking, including vitamins, herbs, eye drops, creams, and over-the-counter medicines.  Previous problems you or members of your family have had with the use of anesthetics.  Any blood disorders you have.  Previous surgeries you have had.  Medical conditions you have. RISKS AND COMPLICATIONS  Generally, this is a safe procedure. However, as with any procedure, complications can occur. Possible complications include:  Bleeding.  Tearing or rupture of the colon wall.  Reaction to medicines given during the exam.  Infection (rare). BEFORE THE PROCEDURE   Ask your health care provider about changing or stopping your regular medicines.  You may be prescribed an oral bowel prep. This involves drinking a large amount of medicated liquid, starting the day before your procedure. The liquid will cause you to have multiple loose stools until your stool is almost clear or light green. This cleans out your colon in preparation for the procedure.  Do not  eat or drink anything else once you have started the bowel prep, unless your health care provider tells you it is safe to do so.  Arrange for someone to drive you home after the procedure. PROCEDURE   You will be given medicine to help you relax (sedative).  You will lie on your side with your knees bent.  A  long, flexible tube with a light and camera on the end (colonoscope) will be inserted through the rectum and into the colon. The camera sends video back to a computer screen as it moves through the colon. The colonoscope also releases carbon dioxide gas to inflate the colon. This helps your health care provider see the area better.  During the exam, your health care provider may take a small tissue sample (biopsy) to be examined under a microscope if any abnormalities are found.  The exam is finished when the entire colon has been viewed. AFTER THE PROCEDURE   Do not drive for 24 hours after the exam.  You may have a small amount of blood in your stool.  You may pass moderate amounts of gas and have mild abdominal cramping or bloating. This is caused by the gas used to inflate your colon during the exam.  Ask when your test results will be ready and how you will get your results. Make sure you get your test results.   This information is not intended to replace advice given to you by your health care provider. Make sure you discuss any questions you have with your health care provider.   Document Released: 03/11/2000 Document Revised: 01/02/2013 Document Reviewed: 11/19/2012 Elsevier Interactive Patient Education 2016 [employer].   Celesta Aver Intertrigo is a skin condition that occurs in between folds of skin in places on the body that rub together a lot and do not get much ventilation. It is caused by heat, moisture, friction, sweat retention, and lack of air circulation, which produces red, irritated patches and, sometimes, scaling or drainage. People who have diabetes, who are obese, or who have treatment with antibiotics are at increased risk for intertrigo. The most common sites for intertrigo to occur include:  The groin.  The breasts.  The armpits.  Folds of abdominal skin.  Webbed spaces between the fingers or toes. Intertrigo may be aggravated  by:  Sweat.  Feces.  Yeast or bacteria that are present near skin folds.  Urine.  Vaginal discharge. HOME CARE INSTRUCTIONS  The following steps can be taken to reduce friction and keep the affected area cool and dry:  Expose skin folds to the air.  Keep deep skin folds separated with cotton or linen cloth. Avoid tight fitting clothing that could cause chafing.  Wear open-toed shoes or sandals to help reduce moisture between the toes.  Apply absorbent powders to affected areas as directed by your caregiver.  Apply over-the-counter barrier pastes, such as zinc oxide, as directed by your caregiver.  If you develop a fungal infection in the affected area, your caregiver may have you use antifungal creams. SEEK MEDICAL CARE IF:   The rash is not improving after 1 week of treatment.  The rash is getting worse (more red, more swollen, more painful, or spreading).  You have a fever or chills. MAKE SURE YOU:   Understand these instructions.  Will watch your condition.  Will get help right away if you are not doing well or get worse.   This information is not intended to replace advice given to you by  your health care provider. Make sure you discuss any questions you have with your health care provider.   Document Released: 03/14/2005 Document Revised: 06/06/2011 Document Reviewed: 09/15/2014 Elsevier Interactive Patient Education 2016 Elsevier Inc. Nystatin; Triamcinolone cream or ointment What is this medicine? NYSTATIN; TRIAMCINOLONE (nye STAT in; trye am SIN oh lone) is a combination of an antifungal medicine and a steroid. It is used to treat certain kinds of fungal or yeast infections of the skin. This medicine may be used for other purposes; ask your health care provider or pharmacist if you have questions. What should I tell my health care provider before I take this medicine? They need to know if you have any of these conditions: -large areas of burned or damaged  skin -skin wasting or thinning -peripheral vascular disease or poor circulation -an unusual or allergic reaction to nystatin, triamcinolone, other corticosteroids, other medicines, foods, dyes, or preservatives -pregnant or trying to get pregnant -breast-feeding How should I use this medicine? This medicine is for external use only. Do not take by mouth. Follow the directions on the prescription label. Wash your hands before and after use. If treating hand or nail infections, wash hands before use only. Apply a thin layer of this medicine to the affected area and rub in gently. Do not use on healthy skin or over large areas of skin. Do not get this medicine in your eyes. If you do, rinse out with plenty of cool tap water. When applying to the groin area, apply a limited amount and do not use for longer than 2 weeks unless directed to by your doctor or health care professional. Do not cover or wrap the treated area with an airtight bandage (such as a plastic bandage). Use the full course of treatment prescribed, even if you think the infection is getting better. Use at regular intervals. Do not use your medicine more often than directed. Do not use this medicine for any condition other than the one for which it was prescribed. Talk to your pediatrician regarding the use of this medicine in children. While this drug may be prescribed for selected conditions, precautions do apply. Children being treated in the diaper area should not wear tight-fitting diapers or plastic pants. Elderly patients are more likely to have damaged skin through aging, and this may increase side effects. This medicine should only be used for brief periods and infrequently in older patients. Overdosage: If you think you have taken too much of this medicine contact a poison control center or emergency room at once. NOTE: This medicine is only for you. Do not share this medicine with others. What if I miss a dose? If you miss a dose,  use it as soon as you can. If it is almost time for your next dose, use only that dose. Do not use double or extra doses. What may interact with this medicine? Interactions are not expected. Do not use any other skin products on the affected area without telling your doctor or health care professional. This list may not describe all possible interactions. Give your health care provider a list of all the medicines, herbs, non-prescription drugs, or dietary supplements you use. Also tell them if you smoke, drink alcohol, or use illegal drugs. Some items may interact with your medicine. What should I watch for while using this medicine? Tell your doctor or health care professional if your symptoms do not start to get better within 1 week when treating the groin area or within 2  weeks when treating the feet. . Tell your doctor or health care professional if you develop sores or blisters that do not heal properly. If your skin infection returns after stopping this medicine, contact your doctor or health care professional. If you are using this medicine to treat an infection in the groin area, do not wear underwear that is tight-fitting or made from synthetic fibers such as rayon or nylon. Instead, wear loose-fitting, cotton underwear. Also dry the area completely after bathing. What side effects may I notice from receiving this medicine? Side effects that you should report to your doctor or health care professional as soon as possible: -burning or itching of the skin -dark red spots on the skin -loss of feeling on skin -painful, red, pus-filled blisters in hair follicles -skin infection -thinning of the skin or sunburn: more likely if applied to the face Side effects that usually do not require medical attention (report to your doctor or health care professional if they continue or are bothersome): -dry or peeling skin -skin irritation This list may not describe all possible side effects. Call your  doctor for medical advice about side effects. You may report side effects to FDA at 1-800-FDA-1088. Where should I keep my medicine? Keep out of the reach of children. Store at room temperature between 15 and 30 degrees C (59 and 86 degrees F). Do not freeze. Throw away any unused medicine after the expiration date. NOTE: This sheet is a summary. It may not cover all possible information. If you have questions about this medicine, talk to your doctor, pharmacist, or health care provider.    2016, Elsevier/Gold Standard. (2007-10-05 17:29:26)

## 2015-04-16 NOTE — Progress Notes (Signed)
Elaine Wells Aug 13, 1940 [id number]   History:    75 y.o.  for annual gyn exam with no complaints today. Patient is being followed at [facility] by her neurologist as a result of her history of multiple sclerosis. Patient states she has been in remission for quite some time now. Review of her record indicated that she has a past history of colon polyps who is colonoscopy was in 2012. Her bone density study last year demonstrated the lowest T score was -1.7 at the left femoral neck and she had normal Frax analysis. Bone stability was reported with the exception increase bone mineralization at the AP spine probably attributed to degenerative changes. Patient is taking her calcium and vitamin D regularly. She received her flu vaccine but has not received her shingles vaccine yet.  . Dr. Lyn Hollingshead is patient's PCP who has been doing her blood work and has been taking care of her for hypertension and diabetes as well as hyperlipidemia.  Past medical history,surgical history, family history and social history were all reviewed and documented in the EPIC chart.  Gynecologic History Patient's last menstrual period was 02/27/1994. Contraception: post menopausal status Last Pap: 2012. Results were: normal Last mammogram: 2016. Results were: normal  Obstetric History OB History  Gravida Para Term Preterm AB SAB TAB Ectopic Multiple Living  3 3 3       3     # Outcome Date GA Lbr Len/2nd Weight Sex Delivery Anes PTL Lv  3 Term 08/01/64   8 lb 5 oz (3.771 kg) F Vag-Spont   Y  2 Term 08/23/60   8 lb 11 oz (3.941 kg) M Vag-Spont   Y  1 Term 06/30/58   9 lb 4 oz (4.196 kg) M Vag-Forceps   Y       ROS: A ROS was performed and pertinent positives and negatives are included in the history.  GENERAL: No fevers or chills. HEENT: No change in vision, no earache, sore throat or sinus congestion. NECK: No pain or stiffness. CARDIOVASCULAR: No chest pain or pressure. No palpitations. PULMONARY: No  shortness of breath, cough or wheeze. GASTROINTESTINAL: No abdominal pain, nausea, vomiting or diarrhea, melena or bright red blood per rectum. GENITOURINARY: No urinary frequency, urgency, hesitancy or dysuria. MUSCULOSKELETAL: No joint or muscle pain, no back pain, no recent trauma. DERMATOLOGIC: No rash, no itching, no lesions. ENDOCRINE: No polyuria, polydipsia, no heat or cold intolerance. No recent change in weight. HEMATOLOGICAL: No anemia or easy bruising or bleeding. NEUROLOGIC: No headache, seizures, numbness, tingling or weakness. PSYCHIATRIC: No depression, no loss of interest in normal activity or change in sleep pattern.     Exam: chaperone present  BP 140/84 mmHg  Ht 5' 2.75" (1.594 m)  Wt 177 lb (80.287 kg)  BMI 31.60 kg/m2  LMP 02/27/1994  Body mass index is 31.6 kg/(m^2).  General appearance : Well developed well nourished female. No acute distress HEENT: Eyes: no retinal hemorrhage or exudates,  Neck supple, trachea midline, no carotid bruits, no thyroidmegaly Lungs: Clear to auscultation, no rhonchi or wheezes, or rib retractions  Heart: Regular rate and rhythm, no murmurs or gallops Breast:Examined in sitting and supine position were symmetrical in appearance, no palpable masses or tenderness,  no skin retraction, no nipple inversion, no nipple discharge, no skin discoloration, no axillary or supraclavicular lymphadenopathy Abdomen: no palpable masses or tenderness, no rebound or guarding Extremities: no edema or skin discoloration or tenderness/intertrigo both groin regions  Pelvic:  Bartholin, Urethra, Skene Glands: Within normal limits             Vagina: No gross lesions or discharge, atrophic changes  Cervix: No gross lesions or discharge  Uterus  anteverted, normal size, shape and consistency, non-tender and mobile  Adnexa  Without masses or tenderness  Anus and perineum  normal   Rectovaginal  normal sphincter tone without palpated masses or  tenderness             Hemoccult PCP will provide     Assessment/Plan:  75 y.o. female for annual exam with history of osteopenia we'll schedule her bone density study in the next few weeks. She is also due for her colonoscopy this year since she had benign colon polyps in her last colonoscopy was in 2012. For her intertrigo she'll be prescribed mytrex cream to apply twice a day for 7-10 days and once a week when necessary. Pap smear no longer indicated. PCP has been doing her blood work. She has an appointment with her internist next week and will check on all her vaccinations. She is not received the shingles vaccine yet.   [name] MD, 2:22 PM 04/16/2015

## 2015-04-27 ENCOUNTER — Encounter: Payer: Self-pay | Admitting: Internal Medicine

## 2015-05-19 DIAGNOSIS — M7060 Trochanteric bursitis, unspecified hip: Secondary | ICD-10-CM | POA: Insufficient documentation

## 2015-08-05 ENCOUNTER — Ambulatory Visit: Payer: Medicare Other | Admitting: Neurology

## 2015-08-25 ENCOUNTER — Ambulatory Visit (INDEPENDENT_AMBULATORY_CARE_PROVIDER_SITE_OTHER): Payer: Medicare Other | Admitting: Neurology

## 2015-08-25 ENCOUNTER — Encounter: Payer: Self-pay | Admitting: Neurology

## 2015-08-25 VITALS — BP 150/60 | HR 68 | Resp 18 | Ht 62.75 in | Wt 173.5 lb

## 2015-08-25 DIAGNOSIS — G4700 Insomnia, unspecified: Secondary | ICD-10-CM | POA: Diagnosis not present

## 2015-08-25 DIAGNOSIS — R260 Ataxic gait: Secondary | ICD-10-CM | POA: Diagnosis not present

## 2015-08-25 DIAGNOSIS — G35 Multiple sclerosis: Secondary | ICD-10-CM

## 2015-08-25 DIAGNOSIS — M25551 Pain in right hip: Secondary | ICD-10-CM | POA: Diagnosis not present

## 2015-08-25 DIAGNOSIS — R350 Frequency of micturition: Secondary | ICD-10-CM

## 2015-08-25 MED ORDER — MIRABEGRON ER 25 MG PO TB24: 25.0000 mg | ORAL_TABLET | Freq: Every day | ORAL | Status: DC

## 2015-08-25 NOTE — Progress Notes (Signed)
[facility]  PATIENT: Elaine Wells DOB: 12-05-1940  REFERRING CLINICIAN: Cyndy Wells  HISTORY FROM: Patient  REASON FOR VISIT: MS   HISTORICAL  CHIEF COMPLAINT:  Chief Complaint  Patient presents with  . Multiple Sclerosis    Elaine Wells has been off of all MS meds for several yrs. Denies new or worsening sx.  Sts. sleep and bladder are much improved since last ov.  Elaine Wells is no longer taking Melatonin or Vesicare. Doesn't think memory is any worse/fim    HISTORY OF PRESENT ILLNESS:  Elaine Wells is a 75 year old woman who was diagnosed with MS about 20 years ago. Elaine Wells has been off medications x 4 years.   At her last visit, Elaine Wells was noting a lot more cognitive issues but Elaine Wells feels Elaine Wells is doing better again.    Bladder and sleep issues are bothering her more.  Gait/strength/sensation:   Elaine Wells notes that her gait is mildly off but not worsening.   Elaine Wells sometimes uses a cane.    Gait is better since left tkr.   No recent falls.    No weakness or numbness.    [name] notes right > left hip pain.     Vision:  Vision is fine.   Elaine Wells never had any MS related vision problems.   Elaine Wells feels vision improved after Elaine Wells has had cataract surgery in the past  Bladder:   Elaine Wells notes urinary frequency and urgency are worse since stopping Vesicare.  Elaine Wells has nocturia only 2 times nightly.    Elaine Wells does not have hesitancy  and Elaine Wells empties her bladder well.   No bowel incontinence.  Fatigue/sleep:   Elaine Wells has physical > cognitive fatigue that is worse as the day goes on.  This is worse with heat.  Elaine Wells falls asleep easily but wakes up a lot (at least twice for bladder).  Elaine Wells snores but nobody has ever told her that Elaine Wells gasps or stops breathing at night.   Elaine Wells does not have excessive daytime sleepiness.     Cognition/Mood:   Elaine Wells feels cognition is better than last visit and Elaine Wells feels STM is better.   Elaine Wells never got any benefit with Aricept and stopped.    Elaine Wells stopped Vesicare and feels STM may nbe a little  better   MS History:   About 22-23 years ago,  Elaine Wells had numbness in the left arm and also other episodes of numbness in the legs. However, an MRI was not done when Elaine Wells presented at that time and since Elaine Wells was having a lot of stress it was felt that there could've been psychologic reasons to her numbness.  A few years later , Elaine Wells had an MRI and was diagnosed with MS. Elaine Wells was initially placed on Betaseron but has not been on Betaseron or other disease modifying therapy for the past 6 years or so. Elaine Wells was seeing Dr. Leotis Wells until he moved to [place].    Elaine Wells continued to be seen at [facility] and was seen Dr. Renne Wells.   Elaine Wells reports being told that her MS lesions were "dried up" and that Elaine Wells did not need to be on a medication.    REVIEW OF SYSTEMS:  Constitutional: No fevers, chills, sweats, or change in appetite.  Elaine Wells has fatigued and poor sleep Eyes: No visual changes, double vision, eye pain Ear, nose and throat: No hearing loss, ear pain, nasal congestion, sore throat Cardiovascular: No chest pain, palpitations Respiratory:  No shortness of breath at rest or  with exertion.   No wheezes.   Elaine Wells snores GastrointestinaI: No nausea, vomiting, diarrhea, abdominal pain, fecal incontinence Genitourinary:  see above.    Musculoskeletal:  Notes R hip pain Integumentary: No rash, pruritus, skin lesions Neurological: as above Psychiatric: Mild depression and anxiety Endocrine: No palpitations, diaphoresis, change in appetite, change in weigh or increased thirst Hematologic/Lymphatic:  No anemia, purpura, petechiae. Allergic/Immunologic: No itchy/runny eyes, nasal congestion, recent allergic reactions, rashes  ALLERGIES: No Known Allergies  HOME MEDICATIONS: Outpatient Prescriptions Prior to Visit  Medication Sig Dispense Refill  . acetaminophen (TYLENOL) 650 MG CR tablet Take 650 mg by mouth every 8 (eight) hours as needed for pain.    Marland Kitchen amLODipine (NORVASC) 10 MG tablet Take 10 mg by mouth daily. For  HTN    . b complex vitamins capsule Take 1 capsule by mouth daily.    . carvedilol (COREG) 25 MG tablet Take 25 mg by mouth 2 (two) times daily with a meal.    . cyclobenzaprine (FLEXERIL) 5 MG tablet Take 5 mg by mouth 3 (three) times daily as needed for muscle spasms.    Marland Kitchen FLUoxetine (PROZAC) 20 MG capsule Take 20 mg by mouth daily. For depression    . FLUZONE HIGH-DOSE 0.5 ML SUSY TO BE ADMINISTERED BY PHARMACIST FOR IMMUNIZATION  0  . Glucosamine 750 MG TABS Take 1 tablet by mouth daily.    Marland Kitchen HYDROcodone-acetaminophen (NORCO) 10-325 MG per tablet Reported on 04/16/2015    . HYDROcodone-acetaminophen (NORCO) 10-325 MG per tablet Take 1 tablet by mouth every 4 (four) hours as needed. Reported on 04/16/2015    . lisinopril (PRINIVIL,ZESTRIL) 20 MG tablet Take 20 mg by mouth daily. For HTN    . Magnesium 250 MG TABS Take 1 tablet by mouth daily.    . Multiple Vitamin (MULTIVITAMIN) tablet Take 1 tablet by mouth daily. supplement    . nystatin-triamcinolone (MYCOLOG II) cream Apply twice a day for 7-10 days and then once to twice a week when necessary. 30 g 5  . Omega 3-6-9 Fatty Acids (OMEGA DHA PO) Take by mouth. 300/1000 mg capsule, take 1200 by mouth daily.    . pravastatin (PRAVACHOL) 20 MG tablet Take 40 mg by mouth daily.     . rivaroxaban (XARELTO) 10 MG TABS tablet Take 10 mg by mouth daily. Reported on 04/16/2015    . donepezil (ARICEPT) 5 MG tablet Take 5 mg by mouth at bedtime. For Alzheimer disease    . Melatonin 5 MG CAPS Take 1 capsule by mouth as needed.     . solifenacin (VESICARE) 5 MG tablet Take 1 tablet (5 mg total) by mouth daily. (Patient taking differently: Take 5 mg by mouth daily. For urinary frequency) 90 tablet 3   No facility-administered medications prior to visit.    PAST MEDICAL HISTORY: Past Medical History  Diagnosis Date  . MS (multiple sclerosis) (HCC)   . OAB (overactive bladder)   . Hypertension   . Hyperlipidemia   . Diabetes mellitus     TYPE 2  .  Depression   . Memory loss   . Vision abnormalities   . Osteoarthritis of left knee     PAST SURGICAL HISTORY: Past Surgical History  Procedure Laterality Date  . Colonoscopy w/ polypectomy  2005  . Cataract extraction    . Replacement total knee Left SEPT 2016    FAMILY HISTORY: Family History  Problem Relation Age of Onset  . Diabetes Mother   . Hypertension Mother   .  Cancer Mother     COLON  . Breast cancer Mother   . Diabetes Maternal Grandmother   . Diabetes Paternal Grandmother   . Cancer Father     PROSTATE AND LUNG    SOCIAL HISTORY:  Social History   Social History  . Marital Status: Widowed    Spouse Name: N/A  . Number of Children: N/A  . Years of Education: N/A   Occupational History  . Not on file.   Social History Main Topics  . Smoking status: Former Smoker -- 20 years    Types: Cigarettes    Quit date: 02/28/1988  . Smokeless tobacco: Never Used  . Alcohol Use: No     Comment: rare  . Drug Use: No  . Sexual Activity: No     Comment: 1st intercourse- 17, partners- 2   Other Topics Concern  . Not on file   Social History Narrative     PHYSICAL EXAM  Filed Vitals:   08/25/15 1058  BP: 150/60  Pulse: 68  Resp: 18  Height: 5' 2.75" (1.594 m)  Weight: 173 lb 8 oz (78.699 kg)      General: The patient is well-developed and well-nourished and in no acute distress.   Elaine Wells has a positive right Faber sign.   Mildly tender over right trochanteric bursa.     Neurologic Exam  Mental status: The patient is alert and oriented x 3 at the time of the examination. The patient has apparent normal recent (3/3 without prompt) and remote memory, with an apparently normal attention span and concentration ability [facility]).     Speech is normal.  Cranial nerves: Extraocular movements are full.   Facial symmetry is present. There is good facial sensation to soft touch bilaterally.Facial strength is normal.  Trapezius and sternocleidomastoid  strength is normal. No dysarthria is noted.  The tongue is midline, and the patient has symmetric elevation of the soft palate. No obvious hearing deficits are noted.  Motor:  Muscle bulk is normal and tone is slightly increased in legs. Strength is  5 / 5 in all 4 extremities.   Sensory: Sensory testing is intact to pinprick, soft touch, vibration sensation, and position sense on all 4 extremities.  Coordination: Cerebellar testing reveals good finger-nose-finger and mildly reduced heel-to-shin bilaterally.  Gait and station: Station is normal with eyes open and closed.    Gait is wide stanced reduced with stride.   Tandem gait is poor and Elaine Wells needs a wider stance.   Romberg is negative.   Reflexes: Deep tendon reflexes are symmetric and brisk bilaterally.      DIAGNOSTIC DATA (LABS, IMAGING, TESTING) - I reviewed patient records, labs, notes, testing and imaging myself where available.      ASSESSMENT AND PLAN   MULTIPLE SCLEROSIS - Plan: MR Brain W Wo Contrast  Ataxic gait  Right hip pain  Urinary frequency  Insomnia    1.   Ok off any MS DMT as very stable for now. We will check an MRI of the brain and compare with her last one to determine if there is any subclinical progression. If we do see subclinical progression, we will need to reconsider a treatment.  Call us right away if Elaine Wells feels Elaine Wells is having an exacerbation. 2.    Elaine Wells had trouble tolerating ditropan and Vesicare with mild cognitive issues.    We will try Myrbetriq.   3.   OTC NSAID for right hip.   If her pain worsens,  Elaine Wells should see her orthopedist.   Stay active and exercise as tolerated .    4.   Elaine Wells will return to see [place] in about 6 months but call sooner if Elaine Wells has new or worsening neurologic symptoms.   Rohaan Durnil A. Epimenio Foot, MD, PhD 08/25/2015, 1:39 PM Certified in Neurology, Clinical Neurophysiology, Sleep Medicine, Pain Medicine and Neuroimaging  [facility] [address] [place], [place] [address] [phone]

## 2015-09-07 ENCOUNTER — Ambulatory Visit
Admission: RE | Admit: 2015-09-07 | Discharge: 2015-09-07 | Disposition: A | Discharge status: Home / Self Care | Payer: Medicare Other | Source: Ambulatory Visit | Attending: Neurology | Admitting: Neurology

## 2015-09-07 DIAGNOSIS — G35 Multiple sclerosis: Secondary | ICD-10-CM

## 2015-09-07 MED ORDER — GADOBENATE DIMEGLUMINE 529 MG/ML IV SOLN
15.0000 mL | Freq: Once | INTRAVENOUS | Status: AC | PRN
  Administered 2015-09-07: 15 mL via INTRAVENOUS

## 2015-09-10 ENCOUNTER — Telehealth: Payer: Self-pay | Admitting: *Deleted

## 2015-09-10 NOTE — Telephone Encounter (Signed)
-----   Message from Asa Lente, MD sent at 09/09/2015  9:19 PM EDT ----- Please let her know the MRi is stable

## 2015-09-10 NOTE — Telephone Encounter (Signed)
LMOM that per RAS, MRI is stable.  She does not need to return this call unless she has questions/fim

## 2016-02-25 ENCOUNTER — Ambulatory Visit: Payer: Medicare Other | Admitting: Neurology

## 2016-03-03 ENCOUNTER — Encounter: Payer: Self-pay | Admitting: Neurology

## 2016-08-10 ENCOUNTER — Encounter: Payer: Self-pay | Admitting: Gynecology

## 2017-02-22 ENCOUNTER — Ambulatory Visit: Payer: Medicare Other | Admitting: Neurology

## 2017-02-23 ENCOUNTER — Other Ambulatory Visit: Payer: Self-pay

## 2017-02-23 ENCOUNTER — Encounter: Payer: Self-pay | Admitting: Neurology

## 2017-02-23 ENCOUNTER — Ambulatory Visit: Payer: Medicare Other | Admitting: Neurology

## 2017-02-23 VITALS — BP 196/91 | HR 69 | Resp 16 | Ht 62.75 in | Wt 176.0 lb

## 2017-02-23 DIAGNOSIS — G35 Multiple sclerosis: Secondary | ICD-10-CM | POA: Diagnosis not present

## 2017-02-23 DIAGNOSIS — R413 Other amnesia: Secondary | ICD-10-CM

## 2017-02-23 DIAGNOSIS — R4189 Other symptoms and signs involving cognitive functions and awareness: Secondary | ICD-10-CM

## 2017-02-23 DIAGNOSIS — R260 Ataxic gait: Secondary | ICD-10-CM

## 2017-02-23 MED ORDER — DONEPEZIL HCL 10 MG PO TABS: 10.0000 mg | ORAL_TABLET | Freq: Every day | ORAL | 11 refills | Status: DC

## 2017-02-23 NOTE — Progress Notes (Signed)
[facility]  PATIENT: Elaine Wells DOB: [birth year]  REFERRING CLINICIAN: Cyndy Freezeony Alexander  HISTORY FROM: Patient  REASON FOR VISIT: MS   HISTORICAL  CHIEF COMPLAINT:  Chief Complaint  Patient presents with  . Multiple Sclerosis    Last seen 08/25/15.  At that time, she had been off of dmt for several years. MRI Brain did not show any changes, so she remained off of dmt.  Her dtr. Diana sts. they are here today for worsening memory onset 3-4 mos. ago.  Has been on Aricept, not sure how long, so not sure if it has helped/fim    HISTORY OF PRESENT ILLNESS:  Kenna GilbertBetty Wells is a 76 year old woman who was diagnosed with MS about 20 years ago.   Update 02/23/2017:   She has had MS for over 20 years. She stopped disease modifying therapies around 2015 and the MRIs of the brain's have not shown any new MS activity. There are no significant changes with gait, strength or sensation.    There has been more difficulty with memory over the past few months. She feels she is the same as last year but her daughter is noting more issues with short term memory.  She took out money form her bank several times but didn't remember going.   She used to read but no longer does so.   She occasionally forgets doing some things like putting on seatbelt.  Of note, she was complaining of some difficulty with memory at her last visit in 2017. She had been on Aricept but stopped because she did not think there was any benefit. She is back on Aricept but is not sure how long. It does not seem to be helping..  She has had more anxiety but no depression.    She sleeps well.  No waking up gasping. I reviewed her last 2 MRIs. There was no new MS activity there was very slight increase in atrophy on her last MRI compared to the previous one.  We had a long discussion about her cognitive problems are MS that she could have a second process such as Alzheimer's disease.   From 08/24/2016: She has been off  medications x 4 years.   At her last visit, she was noting a lot more cognitive issues but she feels she is doing better again.    Bladder and sleep issues are bothering her more.  Gait/strength/sensation:   She notes that her gait is mildly off but not worsening.   She sometimes uses a cane.    Gait is better since left tkr.   No recent falls.    No weakness or numbness.    [name] notes right > left hip pain.     Vision:  Vision is fine.   She never had any MS related vision problems.   She feels vision improved after she has had cataract surgery in the past  Bladder:   She notes urinary frequency and urgency are worse since stopping Vesicare.  She has nocturia only 2 times nightly.    She does not have hesitancy  and she empties her bladder well.   No bowel incontinence.  Fatigue/sleep:   She has physical > cognitive fatigue that is worse as the day goes on.  This is worse with heat.  She falls asleep easily but wakes up a lot (at least twice for bladder).  She snores but nobody has ever told her that she gasps or stops breathing at night.  She does not have excessive daytime sleepiness.     Cognition/Mood:   She feels cognition is better than last visit and she feels STM is better.   She never got any benefit with Aricept and stopped.    She stopped Vesicare and feels STM may nbe a little better   MS History:   About 22-23 years ago,  She had numbness in the left arm and also other episodes of numbness in the legs. However, an MRI was not done when she presented at that time and since she was having a lot of stress it was felt that there could've been psychologic reasons to her numbness.  A few years later , she had an MRI and was diagnosed with MS. She was initially placed on Betaseron but has not been on Betaseron or other disease modifying therapy for the past 6 years or so. She was seeing Dr. Leotis Shames until he moved to [place].    She continued to be seen at [facility] and was seen Dr. Renne Crigler.    She reports being told that her MS lesions were "dried up" and that she did not need to be on a medication.    REVIEW OF SYSTEMS:  Constitutional: No fevers, chills, sweats, or change in appetite.  She has fatigued and poor sleep Eyes: No visual changes, double vision, eye pain Ear, nose and throat: No hearing loss, ear pain, nasal congestion, sore throat Cardiovascular: No chest pain, palpitations Respiratory:  No shortness of breath at rest or with exertion.   No wheezes.   She snores GastrointestinaI: No nausea, vomiting, diarrhea, abdominal pain, fecal incontinence Genitourinary:  see above.    Musculoskeletal:  Notes R hip pain Integumentary: No rash, pruritus, skin lesions Neurological: as above Psychiatric: Mild depression and anxiety Endocrine: No palpitations, diaphoresis, change in appetite, change in weigh or increased thirst Hematologic/Lymphatic:  No anemia, purpura, petechiae. Allergic/Immunologic: No itchy/runny eyes, nasal congestion, recent allergic reactions, rashes  ALLERGIES: No Known Allergies  HOME MEDICATIONS: Outpatient Medications Prior to Visit  Medication Sig Dispense Refill  . acetaminophen (TYLENOL) 650 MG CR tablet Take 650 mg by mouth every 8 (eight) hours as needed for pain.    Marland Kitchen amLODipine (NORVASC) 10 MG tablet Take 10 mg by mouth daily. For HTN    . carvedilol (COREG) 25 MG tablet Take 25 mg by mouth 2 (two) times daily with a meal.    . FLUoxetine (PROZAC) 20 MG capsule Take 20 mg by mouth daily. For depression    . FLUZONE HIGH-DOSE 0.5 ML SUSY TO BE ADMINISTERED BY PHARMACIST FOR IMMUNIZATION  0  . b complex vitamins capsule Take 1 capsule by mouth daily.    . cyclobenzaprine (FLEXERIL) 5 MG tablet Take 5 mg by mouth 3 (three) times daily as needed for muscle spasms.    . Glucosamine 750 MG TABS Take 1 tablet by mouth daily.    Marland Kitchen HYDROcodone-acetaminophen (NORCO) 10-325 MG per tablet Reported on 04/16/2015    . HYDROcodone-acetaminophen (NORCO)  10-325 MG per tablet Take 1 tablet by mouth every 4 (four) hours as needed. Reported on 04/16/2015    . lisinopril (PRINIVIL,ZESTRIL) 20 MG tablet Take 20 mg by mouth daily. For HTN    . Magnesium 250 MG TABS Take 1 tablet by mouth daily.    . Multiple Vitamin (MULTIVITAMIN) tablet Take 1 tablet by mouth daily. supplement    . nystatin-triamcinolone (MYCOLOG II) cream Apply twice a day for 7-10 days and then once to  twice a week when necessary. 30 g 5  . Omega 3-6-9 Fatty Acids (OMEGA DHA PO) Take by mouth. 300/1000 mg capsule, take 1200 by mouth daily.    . pravastatin (PRAVACHOL) 20 MG tablet Take 40 mg by mouth daily.     . rivaroxaban (XARELTO) 10 MG TABS tablet Take 10 mg by mouth daily. Reported on 04/16/2015    . mirabegron ER (MYRBETRIQ) 25 MG TB24 tablet Take 1 tablet (25 mg total) by mouth daily. 30 tablet 11   No facility-administered medications prior to visit.     PAST MEDICAL HISTORY: Past Medical History:  Diagnosis Date  . Depression   . Diabetes mellitus    TYPE 2  . Hyperlipidemia   . Hypertension   . Memory loss   . MS (multiple sclerosis) (HCC)   . OAB (overactive bladder)   . Osteoarthritis of left knee   . Vision abnormalities     PAST SURGICAL HISTORY: Past Surgical History:  Procedure Laterality Date  . CATARACT EXTRACTION    . COLONOSCOPY W/ POLYPECTOMY  2005  . 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 Diabetes Mother   . Hypertension Mother   . Cancer Mother        COLON  . Breast cancer Mother   . Diabetes Maternal Grandmother   . Diabetes Paternal Grandmother   . Cancer Father        PROSTATE AND LUNG    SOCIAL HISTORY:  Social History   Socioeconomic History  . Marital status: Widowed    Spouse name: Not on file  . Number of children: Not on file  . Years of education: Not on file  . Highest education level: Not on file  Social Needs  . Financial resource strain:  Not on file  . Food insecurity - worry: Not on file  . Food insecurity - inability: Not on file  . Transportation needs - medical: Not on file  . Transportation needs - non-medical: Not on file  Occupational History  . Not on file  Tobacco Use  . Smoking status: Former Smoker    Years: 20.00    Types: Cigarettes    Last attempt to quit: 02/28/1988    Years since quitting: 29.0  . Smokeless tobacco: Never Used  Substance and Sexual Activity  . Alcohol use: No    Alcohol/week: 0.0 oz    Comment: rare  . Drug use: No  . Sexual activity: No    Comment: 1st intercourse- 17, partners- 2  Other Topics Concern  . Not on file  Social History Narrative  . Not on file     PHYSICAL EXAM  Vitals:   02/23/17 1522  BP: (!) 196/91  Pulse: 69  Resp: 16  Weight: 176 lb (79.8 kg)  Height: 5' 2.75" (1.594 m)      General: The patient is well-developed and well-nourished and in no acute distress.   She has a positive right Faber sign.   Mildly tender over right trochanteric bursa.     Neurologic Exam  Mental status: The patient is alert and oriented x 3 at the time of the examination. The patient scored 17/30 on the monitor cognitive assessment. She did not get any of the 5 words without hints, got 1 with a hint and 2 with multiple choice cues.  Her clock face was drawn reasonably well.     Speech is normal.  Cranial nerves: Extraocular movements are full.   Facial symmetry is present. There is good facial sensation to soft touch bilaterally.Facial strength is normal.  Trapezius and sternocleidomastoid strength is normal. No dysarthria is noted.  The tongue is midline, and the patient has symmetric elevation of the soft palate. No obvious hearing deficits are noted.  Motor:  Muscle bulk is normal and tone is slightly increased in legs. Strength is  5 / 5 in all 4 extremities.   Sensory: Sensory testing is intact to pinprick, soft touch, vibration sensation, and position sense on all 4  extremities.  Coordination: Cerebellar testing reveals good finger-nose-finger and mildly reduced heel-to-shin bilaterally.  Gait and station: Station is normal with eyes open and closed.    Gait is wide stanced reduced with stride.   Tandem gait is poor and she needs a wider stance.   Romberg is negative.   Reflexes: Deep tendon reflexes are symmetric and brisk bilaterally.      DIAGNOSTIC DATA (LABS, IMAGING, TESTING) - I reviewed patient records, labs, notes, testing and imaging myself where available.      ASSESSMENT AND PLAN   MULTIPLE SCLEROSIS - Plan: Vitamin B12  Multiple sclerosis (HCC) - Plan: Vitamin B12, VITAMIN D 25 Hydroxy (Vit-D Deficiency, Fractures), TSH, T4, Comprehensive metabolic panel, MR BRAIN W WO CONTRAST, CANCELED: MR BRAIN W WO CONTRAST  Ataxic gait - Plan: Vitamin B12  Cognitive impairment - Plan: Vitamin B12, VITAMIN D 25 Hydroxy (Vit-D Deficiency, Fractures), TSH, T4, Comprehensive metabolic panel  Memory loss - Plan: Vitamin B12, VITAMIN D 25 Hydroxy (Vit-D Deficiency, Fractures), TSH, T4, Comprehensive metabolic panel, MR BRAIN W WO CONTRAST   1.    She will remain off of a disease modifying therapy. We need to check an MRI of the brain to make sure that she is not having subclinical activity. If present, she will reconsider getting on a disease modifying therapy. 2.     I discussed with her and her daughter that she could have a second process besides MS such as early Alzheimer's disease. Of note, her last MRI did show a little bit more atrophy than the previous one. I will start donepezil. If she has even more atrophy on this MRI, consider adding Namenda as well.. 3.   I recommend that she not go back on Vesicare or oxybutynin. She will be seeing urology soon.   4.   She will return to see [place] in about 6 months but call sooner if she has new or worsening neurologic symptoms.     40 minutes face-to-face evaluation with greater than one half of the  time counseling and coordinating care about her new memory issues.     Jermisha Hoffart A. Epimenio Foot, MD, PhD 02/23/2017, 4:52 PM Certified in Neurology, Clinical Neurophysiology, Sleep Medicine, Pain Medicine and Neuroimaging  [facility] [address] [place], [place] [address] [phone]

## 2017-02-24 LAB — VITAMIN B12: Vitamin B-12: 260 pg/mL (ref 232–1245)

## 2017-02-24 LAB — VITAMIN D 25 HYDROXY (VIT D DEFICIENCY, FRACTURES): VIT D 25 HYDROXY: 17.3 ng/mL — AB (ref 30.0–100.0)

## 2017-02-24 LAB — T4: T4, Total: 7.9 ug/dL (ref 4.5–12.0)

## 2017-02-24 LAB — COMPREHENSIVE METABOLIC PANEL
ALK PHOS: 98 IU/L (ref 39–117)
ALT: 16 IU/L (ref 0–32)
AST: 28 IU/L (ref 0–40)
Albumin/Globulin Ratio: 1.7 (ref 1.2–2.2)
Albumin: 4.4 g/dL (ref 3.5–4.8)
BUN/Creatinine Ratio: 18 (ref 12–28)
BUN: 17 mg/dL (ref 8–27)
Bilirubin Total: 0.3 mg/dL (ref 0.0–1.2)
CHLORIDE: 102 mmol/L (ref 96–106)
CO2: 27 mmol/L (ref 20–29)
CREATININE: 0.94 mg/dL (ref 0.57–1.00)
Calcium: 10.1 mg/dL (ref 8.7–10.3)
GFR calc Af Amer: 68 mL/min/{1.73_m2} (ref >59)
GFR calc non Af Amer: 59 mL/min/{1.73_m2} — ABNORMAL LOW (ref >59)
GLOBULIN, TOTAL: 2.6 g/dL (ref 1.5–4.5)
GLUCOSE: 130 mg/dL — AB (ref 65–99)
Potassium: 4.3 mmol/L (ref 3.5–5.2)
SODIUM: 142 mmol/L (ref 134–144)
Total Protein: 7 g/dL (ref 6.0–8.5)

## 2017-02-24 LAB — TSH: TSH: 2.05 u[IU]/mL (ref 0.450–4.500)

## 2017-02-27 ENCOUNTER — Telehealth: Payer: Self-pay | Admitting: *Deleted

## 2017-02-27 MED ORDER — VITAMIN D (ERGOCALCIFEROL) 1.25 MG (50000 UNIT) PO CAPS: 50000.0000 [IU] | ORAL_CAPSULE | ORAL | 4 refills | Status: DC

## 2017-02-27 NOTE — Telephone Encounter (Signed)
-----   Message from Richard A Sater, MD sent at 02/24/2017 11:37 AM EST ----- The vitamin D is low. Please call in 50,000 units weekly 12 pills with 4 refills. 

## 2017-02-27 NOTE — Telephone Encounter (Signed)
Noted, thank you

## 2017-02-27 NOTE — Telephone Encounter (Signed)
-----   Message from Asa Lenteichard A Sater, MD sent at 02/24/2017 11:37 AM EST ----- The vitamin D is low. Please call in 50,000 units weekly 12 pills with 4 refills.

## 2017-02-27 NOTE — Telephone Encounter (Signed)
Spoke with dtr. Lafonda Mosses this am and reviewed below lab results.  She verbalized understanding of same, is agreeable to rx. Vit. D.  Rx. escribed to [facility] per her request./fim

## 2017-03-04 ENCOUNTER — Ambulatory Visit (HOSPITAL_BASED_OUTPATIENT_CLINIC_OR_DEPARTMENT_OTHER): Payer: Medicare Other

## 2017-03-11 ENCOUNTER — Ambulatory Visit (HOSPITAL_BASED_OUTPATIENT_CLINIC_OR_DEPARTMENT_OTHER)
Admission: RE | Admit: 2017-03-11 | Discharge: 2017-03-11 | Disposition: A | Discharge status: Home / Self Care | Payer: Medicare Other | Source: Ambulatory Visit | Attending: Neurology | Admitting: Neurology

## 2017-03-11 DIAGNOSIS — G35 Multiple sclerosis: Secondary | ICD-10-CM | POA: Diagnosis present

## 2017-03-11 DIAGNOSIS — R413 Other amnesia: Secondary | ICD-10-CM | POA: Diagnosis present

## 2017-03-11 MED ORDER — GADOBENATE DIMEGLUMINE 529 MG/ML IV SOLN
15.0000 mL | Freq: Once | INTRAVENOUS | Status: AC | PRN
  Administered 2017-03-11: 15 mL via INTRAVENOUS

## 2017-03-13 ENCOUNTER — Telehealth: Payer: Self-pay | Admitting: *Deleted

## 2017-03-13 NOTE — Telephone Encounter (Signed)
LMOM for Elaine Wells that pt's MRI showed no changes when compared to her MRI from last year.  She does not need to return this call unless she has questions/fim

## 2017-03-13 NOTE — Telephone Encounter (Signed)
-----   Message from Asa Lenteichard A Sater, MD sent at 03/12/2017 11:45 AM EST ----- Please let them know that the MRI of the brain was unchanged when compared to the one from last year.

## 2017-08-22 ENCOUNTER — Other Ambulatory Visit: Payer: Self-pay

## 2017-08-22 ENCOUNTER — Ambulatory Visit: Payer: Medicare Other | Admitting: Neurology

## 2017-08-22 ENCOUNTER — Encounter: Payer: Self-pay | Admitting: Neurology

## 2017-08-22 VITALS — BP 148/69 | HR 56 | Resp 20 | Ht 62.75 in | Wt 163.0 lb

## 2017-08-22 DIAGNOSIS — G35 Multiple sclerosis: Secondary | ICD-10-CM

## 2017-08-22 DIAGNOSIS — F329 Major depressive disorder, single episode, unspecified: Secondary | ICD-10-CM | POA: Diagnosis not present

## 2017-08-22 DIAGNOSIS — R260 Ataxic gait: Secondary | ICD-10-CM

## 2017-08-22 DIAGNOSIS — R4189 Other symptoms and signs involving cognitive functions and awareness: Secondary | ICD-10-CM | POA: Diagnosis not present

## 2017-08-22 MED ORDER — AMPHETAMINE-DEXTROAMPHET ER 10 MG PO CP24: 10.0000 mg | ORAL_CAPSULE | Freq: Every day | ORAL | 0 refills | Status: DC

## 2017-08-22 NOTE — Progress Notes (Signed)
[facility]  PATIENT: Elaine Wells DOB: 11-15-1940  REFERRING CLINICIAN: Cyndy Freeze  HISTORY FROM: Patient  REASON FOR VISIT: MS   HISTORICAL  CHIEF COMPLAINT:  Chief Complaint  Patient presents with  . Multiple Sclerosis    Remains off of a dmt for MS. Sts. memory is worse, despite compliance with Aricept and Namenda.  She no longer drives, and her adult son lives with her--they had a falling out in Feb. and he took her to the hospital for eval (psych).  She was taken off of most of her medications and sent home.  Per dtr., psych would like to increase Namenda to bid/fim  . Memory Loss    HISTORY OF PRESENT ILLNESS:  Elaine Wells is a 77 year old woman who was diagnosed with MS about 20 years ago.   Update 08/22/2017: She feels her MS is stable off of a DMT.   The MRI of the brain performed 03/11/2017 does not show any new MS lesions performed about 18 months earlier.  There is slight progression of the atrophy though this is generalized.  Physically, she continues to have a mild gait disturbance but does not need to use a cane.  There is no significant numbness or weakness.  She has urinary frequency and urgency but no incontinence.  She reports mild cognitive issues including reduced STM and verbal fluency.  Although Elaine Wells denies depression, she is more irritable.  Additionally, she has a lot more apathy.   She no longer reads for fun.    She no longer drives. .   She had an episode where she exploded at her son and he took her to the ED.   Per her daughter, she was a little paranoid at that time.  Risperdal 0.5 mg 3 times daily was initially added but that made her too sleepy and she is now on 0.5 mg once a day with benefit.  Namenda was added to the donepezil.   Update 02/23/2017:   She has had MS for over 20 years. She stopped disease modifying therapies around 2015 and the MRIs of the brain's have not shown any new MS activity. There are no significant  changes with gait, strength or sensation.    There has been more difficulty with memory over the past few months. She feels she is the same as last year but her daughter is noting more issues with short term memory.  She took out money form her bank several times but didn't remember going.   She used to read but no longer does so.   She occasionally forgets doing some things like putting on seatbelt.  Of note, she was complaining of some difficulty with memory at her last visit in 2017. She had been on Aricept but stopped because she did not think there was any benefit. She is back on Aricept but is not sure how long. It does not seem to be helping..  She has had more anxiety but no depression.    She sleeps well.  No waking up gasping. I reviewed her last 2 MRIs. There was no new MS activity there was very slight increase in atrophy on her last MRI compared to the previous one.  We had a long discussion about her cognitive problems are MS that she could have a second process such as Alzheimer's disease.   From 08/24/2016: She has been off medications x 4 years.   At her last visit, she was noting a lot more cognitive  issues but she feels she is doing better again.    Bladder and sleep issues are bothering her more.  Gait/strength/sensation:   She notes that her gait is mildly off but not worsening.   She sometimes uses a cane.    Gait is better since left tkr.   No recent falls.    No weakness or numbness.    Elaine Wells notes right > left hip pain.     Vision:  Vision is fine.   She never had any MS related vision problems.   She feels vision improved after she has had cataract surgery in the past  Bladder:   She notes urinary frequency and urgency are worse since stopping Vesicare.  She has nocturia only 2 times nightly.    She does not have hesitancy  and she empties her bladder well.   No bowel incontinence.  Fatigue/sleep:   She has physical > cognitive fatigue that is worse as the day goes on.  This  is worse with heat.  She falls asleep easily but wakes up a lot (at least twice for bladder).  She snores but nobody has ever told her that she gasps or stops breathing at night.   She does not have excessive daytime sleepiness.     Cognition/Mood:   She feels cognition is better than last visit and she feels STM is better.   She never got any benefit with Aricept and stopped.    She stopped Vesicare and feels STM may nbe a little better   MS History:   About 22-23 years ago,  She had numbness in the left arm and also other episodes of numbness in the legs. However, an MRI was not done when she presented at that time and since she was having a lot of stress it was felt that there could've been psychologic reasons to her numbness.  A few years later , she had an MRI and was diagnosed with MS. She was initially placed on Betaseron but has not been on Betaseron or other disease modifying therapy for the past 6 years or so. She was seeing Dr. Leotis Shames until he moved to [place].    She continued to be seen at [facility] and was seen Dr. Renne Crigler.   She reports being told that her MS lesions were "dried up" and that she did not need to be on a medication.    REVIEW OF SYSTEMS:  Constitutional: No fevers, chills, sweats, or change in appetite.  She has fatigued and poor sleep Eyes: No visual changes, double vision, eye pain Ear, nose and throat: No hearing loss, ear pain, nasal congestion, sore throat Cardiovascular: No chest pain, palpitations Respiratory:  No shortness of breath at rest or with exertion.   No wheezes.   She snores GastrointestinaI: No nausea, vomiting, diarrhea, abdominal pain, fecal incontinence Genitourinary:  see above.    Musculoskeletal:  Notes R hip pain Integumentary: No rash, pruritus, skin lesions Neurological: as above Psychiatric: Mild depression and anxiety Endocrine: No palpitations, diaphoresis, change in appetite, change in weigh or increased  thirst Hematologic/Lymphatic:  No anemia, purpura, petechiae. Allergic/Immunologic: No itchy/runny eyes, nasal congestion, recent allergic reactions, rashes  ALLERGIES: No Known Allergies  HOME MEDICATIONS: Outpatient Medications Prior to Visit  Medication Sig Dispense Refill  . amLODipine (NORVASC) 10 MG tablet Take 10 mg by mouth daily. For HTN    . donepezil (ARICEPT) 10 MG tablet Take 1 tablet (10 mg total) by mouth at bedtime. 30 tablet 11  .  FLUoxetine (PROZAC) 20 MG capsule Take 20 mg by mouth daily. For depression    . FLUZONE HIGH-DOSE 0.5 ML SUSY TO BE ADMINISTERED BY PHARMACIST FOR IMMUNIZATION  0  . memantine (NAMENDA) 5 MG tablet     . metoprolol succinate (TOPROL-XL) 25 MG 24 hr tablet     . risperiDONE (RISPERDAL) 0.5 MG tablet Take by mouth.    Marland Kitchen HYDROcodone-acetaminophen (NORCO) 10-325 MG per tablet Take 1 tablet by mouth every 4 (four) hours as needed. Reported on 04/16/2015    . acetaminophen (TYLENOL) 650 MG CR tablet Take 650 mg by mouth every 8 (eight) hours as needed for pain.    Marland Kitchen b complex vitamins capsule Take 1 capsule by mouth daily.    . carvedilol (COREG) 25 MG tablet Take 25 mg by mouth 2 (two) times daily with a meal.    . cyclobenzaprine (FLEXERIL) 5 MG tablet Take 5 mg by mouth 3 (three) times daily as needed for muscle spasms.    . Glucosamine 750 MG TABS Take 1 tablet by mouth daily.    Marland Kitchen HYDROcodone-acetaminophen (NORCO) 10-325 MG per tablet Reported on 04/16/2015    . lisinopril (PRINIVIL,ZESTRIL) 20 MG tablet Take 20 mg by mouth daily. For HTN    . Magnesium 250 MG TABS Take 1 tablet by mouth daily.    . Multiple Vitamin (MULTIVITAMIN) tablet Take 1 tablet by mouth daily. supplement    . nystatin-triamcinolone (MYCOLOG II) cream Apply twice a day for 7-10 days and then once to twice a week when necessary. 30 g 5  . Omega 3-6-9 Fatty Acids (OMEGA DHA PO) Take by mouth. 300/1000 mg capsule, take 1200 by mouth daily.    . pravastatin (PRAVACHOL) 20 MG  tablet Take 40 mg by mouth daily.     . rivaroxaban (XARELTO) 10 MG TABS tablet Take 10 mg by mouth daily. Reported on 04/16/2015    . Vitamin D, Ergocalciferol, (DRISDOL) 50000 units CAPS capsule Take 1 capsule (50,000 Units total) by mouth every 7 (seven) days. 12 capsule 4   No facility-administered medications prior to visit.     PAST MEDICAL HISTORY: Past Medical History:  Diagnosis Date  . Depression   . Diabetes mellitus    TYPE 2  . Hyperlipidemia   . Hypertension   . Memory loss   . MS (multiple sclerosis) (HCC)   . OAB (overactive bladder)   . Osteoarthritis of left knee   . Vision abnormalities     PAST SURGICAL HISTORY: Past Surgical History:  Procedure Laterality Date  . CATARACT EXTRACTION    . COLONOSCOPY W/ POLYPECTOMY  2005  . 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 Diabetes Mother   . Hypertension Mother   . Cancer Mother        COLON  . Breast cancer Mother   . Diabetes Maternal Grandmother   . Diabetes Paternal Grandmother   . Cancer Father        PROSTATE AND LUNG    SOCIAL HISTORY:  Social History   Socioeconomic History  . Marital status: Widowed    Spouse name: Not on file  . Number of children: Not on file  . Years of education: Not on file  . Highest education level: Not on file  Occupational History  . Not on file  Social Needs  . Financial resource strain: Not on file  . Food insecurity:  Worry: Not on file    Inability: Not on file  . Transportation needs:    Medical: Not on file    Non-medical: Not on file  Tobacco Use  . Smoking status: Former Smoker    Years: 20.00    Types: Cigarettes    Last attempt to quit: 02/28/1988    Years since quitting: 29.5  . Smokeless tobacco: Never Used  Substance and Sexual Activity  . Alcohol use: No    Alcohol/week: 0.0 oz    Comment: rare  . Drug use: No  . Sexual activity: Never    Comment: 1st intercourse-  17, partners- 2  Lifestyle  . Physical activity:    Days per week: Not on file    Minutes per session: Not on file  . Stress: Not on file  Relationships  . Social connections:    Talks on phone: Not on file    Gets together: Not on file    Attends religious service: Not on file    Active member of club or organization: Not on file    Attends meetings of clubs or organizations: Not on file    Relationship status: Not on file  . Intimate partner violence:    Fear of current or ex partner: Not on file    Emotionally abused: Not on file    Physically abused: Not on file    Forced sexual activity: Not on file  Other Topics Concern  . Not on file  Social History Narrative  . Not on file     PHYSICAL EXAM  Vitals:   08/22/17 1454  BP: (!) 148/69  Pulse: (!) 56  Resp: 20  Weight: 163 lb (73.9 kg)  Height: 5' 2.75" (1.594 m)      General: The patient is well-developed and well-nourished and in no acute distress.   She has a positive right Faber sign.   Mildly tender over right trochanteric bursa.     Neurologic Exam  Mental status: The patient is alert and oriented x 3 at the time of the examination.  Focus and attention is reduced.  Short-term memory is reduced.  Speech is normal.  Cranial nerves: Extraocular movements are full.   Facial strength and sensation is normal.  Trapezius strength is normal.  There is no dysarthria.  The tongue is midline, and the patient has symmetric elevation of the soft palate. No obvious hearing deficits are noted.  Motor:  Muscle bulk is normal and tone is slightly increased in legs.  Strength is 5/5 in the arms and legs..   Sensory: Sensory testing is intact to pinprick, soft touch, vibration sensation, and position sense on all 4 extremities.  Coordination: Cerebellar testing reveals good finger-nose-finger and mildly reduced heel-to-shin bilaterally.  Gait and station: Station is normal with eyes open and closed.   The gait is mildly  wide with a mildly reduced stride.  She has difficulty with tandem walk.  Romberg is negative.   Reflexes: Deep tendon reflexes are symmetric and brisk bilaterally.      DIAGNOSTIC DATA (LABS, IMAGING, TESTING) - I reviewed patient records, labs, notes, testing and imaging myself where available.      ASSESSMENT AND PLAN   MULTIPLE SCLEROSIS  Major depression, chronic  Cognitive impairment  Ataxic gait   1.    She will continue off of a disease modifying therapy.  In about a year I will check another MRI to make sure that she is not having subclinical progression.  2.    She will continue donepezil and increase the Namenda.    I will add Adderall XR 10 mg daily to see if that can help some of the apathy and focus/attention.   3.  She will remain off of the bladder medications as that may worsen memory.   4.   Follow-up with psychiatry 5.   She will return to see me in 6 months or sooner if there are new or worsening neurologic symptoms.   Zeyna Mkrtchyan A. Epimenio Foot, MD, PhD 08/22/2017, 4:05 PM Certified in Neurology, Clinical Neurophysiology, Sleep Medicine, Pain Medicine and Neuroimaging  [facility] [address] [place], [place] [address] [phone]

## 2017-09-19 ENCOUNTER — Other Ambulatory Visit: Payer: Self-pay | Admitting: Neurology

## 2017-09-19 MED ORDER — AMPHETAMINE-DEXTROAMPHET ER 10 MG PO CP24: 10.0000 mg | ORAL_CAPSULE | Freq: Every day | ORAL | 0 refills | Status: DC

## 2017-09-19 NOTE — Telephone Encounter (Signed)
Pt's daughter/Dianna request refill for amphetamine-dextroamphetamine (ADDERALL XR) 10 MG 24 hr capsule [facility] to [facility]

## 2017-10-19 ENCOUNTER — Other Ambulatory Visit: Payer: Self-pay | Admitting: Neurology

## 2017-10-19 MED ORDER — AMPHETAMINE-DEXTROAMPHET ER 10 MG PO CP24: 10.0000 mg | ORAL_CAPSULE | Freq: Every day | ORAL | 0 refills | Status: DC

## 2017-10-19 NOTE — Telephone Encounter (Signed)
Pt's daughter/Dianna request refill for amphetamine-dextroamphetamine (ADDERALL XR) 10 MG 24 hr capsule sent to [facility]

## 2017-11-20 ENCOUNTER — Other Ambulatory Visit: Payer: Self-pay | Admitting: Neurology

## 2017-11-20 MED ORDER — AMPHETAMINE-DEXTROAMPHET ER 10 MG PO CP24: 10.0000 mg | ORAL_CAPSULE | Freq: Every day | ORAL | 0 refills | Status: DC

## 2017-11-20 NOTE — Telephone Encounter (Signed)
Pt's daughter Dianna/DPR request refill for amphetamine-dextroamphetamine (ADDERALL XR) 10 MG 24 hr capsule sent to [facility] [address] [place], [place] - [address]

## 2017-12-19 ENCOUNTER — Other Ambulatory Visit: Payer: Self-pay | Admitting: Neurology

## 2017-12-19 MED ORDER — AMPHETAMINE-DEXTROAMPHET ER 10 MG PO CP24: 10.0000 mg | ORAL_CAPSULE | Freq: Every day | ORAL | 0 refills | Status: DC

## 2017-12-19 NOTE — Telephone Encounter (Signed)
Pt Daughter(on DPR-Davis,Dianna) has called for a refill on amphetamine-dextroamphetamine (ADDERALL XR) 10 MG 24 hr capsule [facility] [address] [place], [place] - [address] [phone] (Phone) [phone] (Fax)

## 2017-12-19 NOTE — Addendum Note (Signed)
Addended by: Candis Schatz I on: 12/19/2017 11:48 AM   Modules accepted: Orders

## 2018-01-19 ENCOUNTER — Telehealth: Payer: Self-pay | Admitting: Neurology

## 2018-01-19 MED ORDER — AMPHETAMINE-DEXTROAMPHET ER 10 MG PO CP24: 10.0000 mg | ORAL_CAPSULE | Freq: Every day | ORAL | 0 refills | Status: DC

## 2018-01-19 NOTE — Telephone Encounter (Signed)
Daughter called to refill her adderall 10mg  daily,x 30 with no refill.

## 2018-02-20 ENCOUNTER — Other Ambulatory Visit: Payer: Self-pay | Admitting: Neurology

## 2018-02-20 MED ORDER — AMPHETAMINE-DEXTROAMPHET ER 10 MG PO CP24: 10.0000 mg | ORAL_CAPSULE | Freq: Every day | ORAL | 0 refills | Status: DC

## 2018-02-20 NOTE — Telephone Encounter (Signed)
Pt daughter(on DPR-Davis,Dianna) has called for a refill on amphetamine-dextroamphetamine (ADDERALL XR) 10 MG 24 hr capsule  [facility] [address]

## 2018-02-20 NOTE — Addendum Note (Signed)
Addended by: Hillis Range on: 02/20/2018 08:57 AM   Modules accepted: Orders

## 2018-03-01 ENCOUNTER — Ambulatory Visit: Payer: Medicare Other | Admitting: Neurology

## 2018-03-01 ENCOUNTER — Telehealth: Payer: Self-pay | Admitting: *Deleted

## 2018-03-01 NOTE — Telephone Encounter (Signed)
Called, LVM twice for pt. Earlier to cx appt today at 230pm d/t problems with internet/phones.   IF she calls back can offer her appt on 03/06/18 (emg slot).

## 2018-03-23 ENCOUNTER — Telehealth: Payer: Self-pay | Admitting: Neurology

## 2018-03-23 ENCOUNTER — Other Ambulatory Visit: Payer: Self-pay | Admitting: Neurology

## 2018-03-23 MED ORDER — AMPHETAMINE-DEXTROAMPHET ER 10 MG PO CP24: 10.0000 mg | ORAL_CAPSULE | Freq: Every day | ORAL | 0 refills | Status: DC

## 2018-03-23 NOTE — Telephone Encounter (Signed)
Pt requesting refills for amphetamine-dextroamphetamine (ADDERALL XR) 10 MG 24 hr capsule sent to [facility]

## 2018-04-19 ENCOUNTER — Other Ambulatory Visit: Payer: Self-pay

## 2018-04-19 MED ORDER — AMPHETAMINE-DEXTROAMPHET ER 10 MG PO CP24: 10.0000 mg | ORAL_CAPSULE | Freq: Every day | ORAL | 0 refills | Status: DC

## 2018-04-19 NOTE — Telephone Encounter (Signed)
Patient's daughter called wanting a refill of amphetamine-dextroamphetamine (ADDERALL XR) 10 MG 24 hr capsule sent to pharmacy on file.

## 2018-04-19 NOTE — Telephone Encounter (Signed)
Called and spoke with daughter. Offered sooner appt next week. She wanted after 1:30pm. She works in [employer] and does not get off until then. Advised we do not having anything available right now.  R/s her appt for 05/03/18 at 1:30pm. Cx her appt 06/06/18 since she is coming in sooner.  Advised I will send refill request to Dr. Epimenio Foot for Adderall.  We had to cx her 03/01/18 appt d/t internet/phones note working that day.

## 2018-04-19 NOTE — Addendum Note (Signed)
Addended by: Hillis Range on: 04/19/2018 02:31 PM   Modules accepted: Orders

## 2018-05-03 ENCOUNTER — Ambulatory Visit: Payer: Medicare Other | Admitting: Neurology

## 2018-05-03 ENCOUNTER — Encounter: Payer: Self-pay | Admitting: Neurology

## 2018-05-03 VITALS — BP 155/60 | HR 54 | Ht 62.75 in | Wt 173.5 lb

## 2018-05-03 DIAGNOSIS — F32A Depression, unspecified: Secondary | ICD-10-CM

## 2018-05-03 DIAGNOSIS — R350 Frequency of micturition: Secondary | ICD-10-CM

## 2018-05-03 DIAGNOSIS — F329 Major depressive disorder, single episode, unspecified: Secondary | ICD-10-CM

## 2018-05-03 DIAGNOSIS — R5382 Chronic fatigue, unspecified: Secondary | ICD-10-CM

## 2018-05-03 DIAGNOSIS — R413 Other amnesia: Secondary | ICD-10-CM | POA: Diagnosis not present

## 2018-05-03 DIAGNOSIS — G35 Multiple sclerosis: Secondary | ICD-10-CM

## 2018-05-03 DIAGNOSIS — R4189 Other symptoms and signs involving cognitive functions and awareness: Secondary | ICD-10-CM

## 2018-05-03 DIAGNOSIS — R260 Ataxic gait: Secondary | ICD-10-CM

## 2018-05-03 NOTE — Progress Notes (Signed)
[facility]  PATIENT: Elaine Wells DOB: [birth year]  REFERRING CLINICIAN: Cyndy Freezeony Alexander  HISTORY FROM: Patient  REASON FOR VISIT: MS   HISTORICAL  CHIEF COMPLAINT:  Chief Complaint  Patient presents with  . Follow-up    RM 12 with daughter. Last seen 07/2017. No falls. No new sx.  . Multiple Sclerosis    On Not on DMT    HISTORY OF PRESENT ILLNESS:  Elaine Wells is a 78 year old woman who was diagnosed with MS about 20 years ago.   Update 05/03/2018: For the most part, she is stable but cognitive issues are worse.       Gait is unchanged.   She can climb stairs without much issue.  She holds the bannister.   No change in strength or sensation.    She has urge incontinence helped a lot by oxybutynin.  We discussed tradeoff of bladder vs. Cognition and beenfit of oxybutynin is large so they would like to continue.     Vision is doing the same.   She is having more cognitive issues.   She doesn't remember things from earlier in the day.     She has some fatigue.   Depression is doing much better on Prozac.       Update 08/22/2017: She feels her MS is stable off of a DMT.   The MRI of the brain performed 03/11/2017 does not show any new MS lesions performed about 18 months earlier.  There is slight progression of the atrophy though this is generalized.  Physically, she continues to have a mild gait disturbance but does not need to use a cane.  There is no significant numbness or weakness.  She has urinary frequency and urgency but no incontinence.  She reports mild cognitive issues including reduced STM and verbal fluency.  Although Elaine Wells denies depression, she is more irritable.  Additionally, she has a lot more apathy.   She no longer reads for fun.    She no longer drives. .   She had an episode where she exploded at her son and he took her to the ED.   Per her daughter, she was a little paranoid at that time.  Risperdal 0.5 mg 3 times daily was initially added but  that made her too sleepy and she is now on 0.5 mg once a day with benefit.  Namenda was added to the donepezil.   Update 02/23/2017:   She has had MS for over 20 years. She stopped disease modifying therapies around 2015 and the MRIs of the brain's have not shown any new MS activity. There are no significant changes with gait, strength or sensation.    There has been more difficulty with memory over the past few months. She feels she is the same as last year but her daughter is noting more issues with short term memory.  She took out money form her bank several times but didn't remember going.   She used to read but no longer does so.   She occasionally forgets doing some things like putting on seatbelt.  Of note, she was complaining of some difficulty with memory at her last visit in 2017. She had been on Aricept but stopped because she did not think there was any benefit. She is back on Aricept but is not sure how long. It does not seem to be helping..  She has had more anxiety but no depression.    She sleeps well.  No waking up  gasping. I reviewed her last 2 MRIs. There was no new MS activity there was very slight increase in atrophy on her last MRI compared to the previous one.  We had a long discussion about her cognitive problems are MS that she could have a second process such as Alzheimer's disease.   From 08/24/2016: She has been off medications x 4 years.   At her last visit, she was noting a lot more cognitive issues but she feels she is doing better again.    Bladder and sleep issues are bothering her more.  Gait/strength/sensation:   She notes that her gait is mildly off but not worsening.   She sometimes uses a cane.    Gait is better since left tkr.   No recent falls.    No weakness or numbness.    [name] notes right > left hip pain.     Vision:  Vision is fine.   She never had any MS related vision problems.   She feels vision improved after she has had cataract surgery in the  past  Bladder:   She notes urinary frequency and urgency are worse since stopping Vesicare.  She has nocturia only 2 times nightly.    She does not have hesitancy  and she empties her bladder well.   No bowel incontinence.  Fatigue/sleep:   She has physical > cognitive fatigue that is worse as the day goes on.  This is worse with heat.  She falls asleep easily but wakes up a lot (at least twice for bladder).  She snores but nobody has ever told her that she gasps or stops breathing at night.   She does not have excessive daytime sleepiness.     Cognition/Mood:   She feels cognition is better than last visit and she feels STM is better.   She never got any benefit with Aricept and stopped.    She stopped Vesicare and feels STM may nbe a little better   MS History:   About 22-23 years ago,  She had numbness in the left arm and also other episodes of numbness in the legs. However, an MRI was not done when she presented at that time and since she was having a lot of stress it was felt that there could've been psychologic reasons to her numbness.  A few years later , she had an MRI and was diagnosed with MS. She was initially placed on Betaseron but has not been on Betaseron or other disease modifying therapy for the past 6 years or so. She was seeing Dr. Leotis Shames until he moved to [place].    She continued to be seen at [facility] and was seen Dr. Renne Crigler.   She reports being told that her MS lesions were "dried up" and that she did not need to be on a medication.    REVIEW OF SYSTEMS:  Constitutional: No fevers, chills, sweats, or change in appetite.  She has fatigued and poor sleep Eyes: No visual changes, double vision, eye pain Ear, nose and throat: No hearing loss, ear pain, nasal congestion, sore throat Cardiovascular: No chest pain, palpitations Respiratory:  No shortness of breath at rest or with exertion.   No wheezes.   She snores GastrointestinaI: No nausea, vomiting, diarrhea, abdominal  pain, fecal incontinence Genitourinary:  see above.    Musculoskeletal:  Notes R hip pain Integumentary: No rash, pruritus, skin lesions Neurological: as above Psychiatric: Mild depression and anxiety Endocrine: No palpitations, diaphoresis, change in appetite,  change in weigh or increased thirst Hematologic/Lymphatic:  No anemia, purpura, petechiae. Allergic/Immunologic: No itchy/runny eyes, nasal congestion, recent allergic reactions, rashes  ALLERGIES: No Known Allergies  HOME MEDICATIONS: Outpatient Medications Prior to Visit  Medication Sig Dispense Refill  . amLODipine (NORVASC) 10 MG tablet Take 10 mg by mouth daily. For HTN    . amphetamine-dextroamphetamine (ADDERALL XR) 10 MG 24 hr capsule Take 1 capsule (10 mg total) by mouth daily. 30 capsule 0  . donepezil (ARICEPT) 10 MG tablet Take 1 tablet (10 mg total) by mouth at bedtime. 30 tablet 11  . FLUoxetine (PROZAC) 20 MG capsule Take 20 mg by mouth daily. For depression    . FLUZONE HIGH-DOSE 0.5 ML SUSY TO BE ADMINISTERED BY PHARMACIST FOR IMMUNIZATION  0  . memantine (NAMENDA) 10 MG tablet Take 1 tablet by mouth 2 (two) times daily.    . metoprolol succinate (TOPROL-XL) 25 MG 24 hr tablet     . oxybutynin (DITROPAN) 5 MG tablet Take 1 tablet by mouth 2 (two) times daily.    . risperiDONE (RISPERDAL) 0.5 MG tablet Take by mouth.    Marland Kitchen HYDROcodone-acetaminophen (NORCO) 10-325 MG per tablet Take 1 tablet by mouth every 4 (four) hours as needed. Reported on 04/16/2015    . memantine (NAMENDA) 5 MG tablet      No facility-administered medications prior to visit.     PAST MEDICAL HISTORY: Past Medical History:  Diagnosis Date  . Depression   . Diabetes mellitus    TYPE 2  . Hyperlipidemia   . Hypertension   . Memory loss   . MS (multiple sclerosis) (HCC)   . OAB (overactive bladder)   . Osteoarthritis of left knee   . Vision abnormalities     PAST SURGICAL HISTORY: Past Surgical History:  Procedure Laterality Date   . CATARACT EXTRACTION    . COLONOSCOPY W/ POLYPECTOMY  2005  . 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 Diabetes Mother   . Hypertension Mother   . Cancer Mother        COLON  . Breast cancer Mother   . Diabetes Maternal Grandmother   . Diabetes Paternal Grandmother   . Cancer Father        PROSTATE AND LUNG    SOCIAL HISTORY:  Social History   Socioeconomic History  . Marital status: Widowed    Spouse name: Not on file  . Number of children: Not on file  . Years of education: Not on file  . Highest education level: Not on file  Occupational History  . Not on file  Social Needs  . Financial resource strain: Not on file  . Food insecurity:    Worry: Not on file    Inability: Not on file  . Transportation needs:    Medical: Not on file    Non-medical: Not on file  Tobacco Use  . Smoking status: Former Smoker    Years: 20.00    Types: Cigarettes    Last attempt to quit: 02/28/1988    Years since quitting: 30.1  . Smokeless tobacco: Never Used  Substance and Sexual Activity  . Alcohol use: No    Alcohol/week: 0.0 standard drinks    Comment: rare  . Drug use: No  . Sexual activity: Never    Comment: 1st intercourse- 17, partners- 2  Lifestyle  . Physical activity:    Days per week: Not on  file    Minutes per session: Not on file  . Stress: Not on file  Relationships  . Social connections:    Talks on phone: Not on file    Gets together: Not on file    Attends religious service: Not on file    Active member of club or organization: Not on file    Attends meetings of clubs or organizations: Not on file    Relationship status: Not on file  . Intimate partner violence:    Fear of current or ex partner: Not on file    Emotionally abused: Not on file    Physically abused: Not on file    Forced sexual activity: Not on file  Other Topics Concern  . Not on file  Social History Narrative  .  Not on file     PHYSICAL EXAM  Vitals:   05/03/18 1303  BP: (!) 155/60  Pulse: (!) 54  Weight: 173 lb 8 oz (78.7 kg)  Height: 5' 2.75" (1.594 m)      General: The patient is well-developed and well-nourished and in no acute distress.   She has a positive right Faber sign.   Mildly tender over right trochanteric bursa.     Neurologic Exam  Mental status: The patient is alert and oriented x 3 at the time of the examination.  Focus and attention is reduced.  Short-term memory is reduced.  Speech is normal.  Cranial nerves: Extraocular movements are full.  Facial strength is normal.  Trapezius strength is normal.  No obvious hearing deficits are noted.  Motor:  Muscle bulk is normal and tone is slightly increased in legs.  Strength is 5/5 in the arms and legs..   Sensory: She has intact sensation to touch in all 4 extremities  Coordination: Cerebellar testing reveals good finger-nose-finger and mildly reduced heel-to-shin bilaterally.  Gait and station: Station is normal with eyes open and closed.  Her gait is mildly wide.  Tandem gait is wide.  Romberg is negative.   Reflexes: Deep tendon reflexes are symmetric and brisk bilaterally.      DIAGNOSTIC DATA (LABS, IMAGING, TESTING) - I reviewed patient records, labs, notes, testing and imaging myself where available.      ASSESSMENT AND PLAN   Depression, unspecified depression type  Memory loss - Plan: MR BRAIN WO CONTRAST  Multiple sclerosis (HCC) - Plan: MR BRAIN WO CONTRAST  Urinary frequency  Chronic fatigue  Cognitive impairment  Ataxic gait   1.    She has not had any exacerbations will have a disease modifying therapy and we may be able to keep her off of medication for MS.  We will recheck an MRI of the brain to determine if there is subclinical activity and we consider a DMT based on the results. 2.    Memory loss is becoming more of a problem combined with other cognitive issues.  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  She will continue donepezil and increase the Namenda.    I will add Adderall XR 10 mg daily to see if that can help some of the apathy and focus/attention.   3.  Stopping the oxybutynin did not help the memory or other cognitive issues any and she went back on.  She will continue..   4.   Follow-up with psychiatry 5.   She will return to see me in 6 months or sooner if there are new or worsening neurologic  symptoms.   Siera Beyersdorf A. Epimenio Foot, MD, PhD 05/03/2018, 1:41 PM Certified in Neurology, Clinical Neurophysiology, Sleep Medicine, Pain Medicine and Neuroimaging  [facility] [address] [place], [place] [address] [phone]

## 2018-05-04 ENCOUNTER — Telehealth: Payer: Self-pay | Admitting: Neurology

## 2018-05-04 NOTE — Telephone Encounter (Signed)
[employer] medicare no auth.. order sent to [facility] they will reach out to the pt to schedule.

## 2018-05-12 ENCOUNTER — Ambulatory Visit (HOSPITAL_BASED_OUTPATIENT_CLINIC_OR_DEPARTMENT_OTHER)
Admission: RE | Admit: 2018-05-12 | Discharge: 2018-05-12 | Disposition: A | Discharge status: Home / Self Care | Payer: Medicare Other | Source: Ambulatory Visit | Attending: Neurology | Admitting: Neurology

## 2018-05-12 DIAGNOSIS — G35 Multiple sclerosis: Secondary | ICD-10-CM | POA: Diagnosis present

## 2018-05-12 DIAGNOSIS — R413 Other amnesia: Secondary | ICD-10-CM | POA: Insufficient documentation

## 2018-05-14 ENCOUNTER — Telehealth: Payer: Self-pay | Admitting: *Deleted

## 2018-05-14 NOTE — Telephone Encounter (Signed)
-----   Message from Asa Lente, MD sent at 05/14/2018 12:49 PM EST ----- Please let the patient know that the MRI of the brain was stable.

## 2018-05-14 NOTE — Telephone Encounter (Signed)
Tried calling daughter, Bernita Buffy (HPOA). Went to VM and VM full. Ok to relay MRI brain stable per Dr. Epimenio Foot if she calls back.

## 2018-05-15 NOTE — Telephone Encounter (Signed)
Called and spoke with daughter about MRI results per Dr. Epimenio Foot note. She verbalized understanding.

## 2018-05-22 ENCOUNTER — Other Ambulatory Visit: Payer: Self-pay | Admitting: Neurology

## 2018-05-22 MED ORDER — AMPHETAMINE-DEXTROAMPHET ER 10 MG PO CP24: ORAL_CAPSULE | ORAL | 0 refills | Status: DC

## 2018-05-22 NOTE — Telephone Encounter (Signed)
Pt daughter has called for a refill on pt's amphetamine-dextroamphetamine (ADDERALL XR) 10 MG 24 hr capsule [address] [address] [place] [address]

## 2018-05-22 NOTE — Addendum Note (Signed)
Addended by: Hillis Range on: 05/22/2018 01:54 PM   Modules accepted: Orders

## 2018-06-06 ENCOUNTER — Ambulatory Visit: Payer: Medicare Other | Admitting: Neurology

## 2018-06-06 ENCOUNTER — Encounter

## 2018-06-20 ENCOUNTER — Other Ambulatory Visit: Payer: Self-pay | Admitting: Neurology

## 2018-06-20 MED ORDER — AMPHETAMINE-DEXTROAMPHET ER 10 MG PO CP24: ORAL_CAPSULE | ORAL | 0 refills | Status: DC

## 2018-06-20 NOTE — Telephone Encounter (Signed)
Checked drug registry. She last refilled 05/23/18 #30. Last seen 05/03/18 and no f/u scheduled.  I called pt to get 6 month f/u scheduled around 11/01/18. I LVM for her to call back and schedule this.

## 2018-06-20 NOTE — Telephone Encounter (Signed)
Pt called in for a refill of   amphetamine-dextroamphetamine (ADDERALL XR) 10 MG 24 hr capsule   To be sent to  [facility] [address] [place], [place] - [address] [phone] (Phone) [phone] (Fax)

## 2018-07-22 ENCOUNTER — Other Ambulatory Visit: Payer: Self-pay | Admitting: Neurology

## 2018-07-22 NOTE — Telephone Encounter (Signed)
Adele Schilder called yesterday wanting a refill for Adderall.  I indicated that office policy does not allow refills for controlled substances after hours or on weekends, she will need to call back on 27 April for refill.  She needs to call during office hours.

## 2018-07-23 MED ORDER — AMPHETAMINE-DEXTROAMPHET ER 10 MG PO CP24: ORAL_CAPSULE | ORAL | 0 refills | Status: DC

## 2018-07-23 NOTE — Addendum Note (Signed)
Addended by: Hillis Range on: 07/23/2018 08:39 AM   Modules accepted: Orders

## 2018-07-23 NOTE — Telephone Encounter (Signed)
Pt is requesting refill for amphetamine-dextroamphetamine (ADDERALL XR) 10 MG 24 hr capsule to be sent to [facility] [address] [place], [place] - [address] , pt ran out of meds over the weekend , wants to know if meds can be filled this morning

## 2018-08-22 ENCOUNTER — Other Ambulatory Visit: Payer: Self-pay | Admitting: Neurology

## 2018-08-22 MED ORDER — AMPHETAMINE-DEXTROAMPHET ER 10 MG PO CP24: ORAL_CAPSULE | ORAL | 0 refills | Status: DC

## 2018-08-22 NOTE — Telephone Encounter (Signed)
Pt is needing a refill on her amphetamine-dextroamphetamine (ADDERALL XR) 10 MG 24 hr capsule sent to [facility] [address]

## 2018-08-22 NOTE — Telephone Encounter (Signed)
Reviewed pt chart. She was last seen 05/03/18 and no f/u scheduled for her 6 month f/u. She is due for f/u around 11/01/18.  I called and LVM for pt to call to let her know I scheduled her follow up for 11/01/18 at 1pm. Asked her to call back and r/s around this date if this appt does not work. I checked drug registry. She last refilled rx 07/23/18 #30.

## 2018-09-20 ENCOUNTER — Other Ambulatory Visit: Payer: Self-pay | Admitting: *Deleted

## 2018-09-20 ENCOUNTER — Telehealth: Payer: Self-pay | Admitting: Neurology

## 2018-09-20 MED ORDER — AMPHETAMINE-DEXTROAMPHET ER 10 MG PO CP24: ORAL_CAPSULE | ORAL | 0 refills | Status: DC

## 2018-09-20 NOTE — Telephone Encounter (Signed)
Last refill of Adderall XR 10 mg capsule was on 08/22/2018 prescribed by Dr. Felecia Shelling. Will send refill to work-in MD Dr. Jaynee Eagles for refill due on 09/22/2018.

## 2018-09-20 NOTE — Telephone Encounter (Signed)
Pt daughter has called for a refill on amphetamine-dextroamphetamine (ADDERALL XR) 10 MG 24 hr capsule  [address] [place] [address]

## 2018-10-23 ENCOUNTER — Other Ambulatory Visit: Payer: Self-pay | Admitting: Neurology

## 2018-10-23 MED ORDER — AMPHETAMINE-DEXTROAMPHET ER 10 MG PO CP24: ORAL_CAPSULE | ORAL | 0 refills | Status: DC

## 2018-10-23 NOTE — Telephone Encounter (Signed)
Pt is requesting a refill of amphetamine-dextroamphetamine (ADDERALL XR) 10 MG 24 hr capsule  , to be sent to [place], [place] - [address]

## 2018-11-01 ENCOUNTER — Ambulatory Visit: Payer: Self-pay | Admitting: Neurology

## 2018-11-22 ENCOUNTER — Other Ambulatory Visit: Payer: Self-pay | Admitting: Neurology

## 2018-11-22 MED ORDER — AMPHETAMINE-DEXTROAMPHET ER 10 MG PO CP24: ORAL_CAPSULE | ORAL | 0 refills | Status: DC

## 2018-11-22 NOTE — Addendum Note (Signed)
Addended by: Hope Pigeon on: 11/22/2018 08:56 AM   Modules accepted: Orders

## 2018-11-22 NOTE — Telephone Encounter (Signed)
Pt daughter calling for a refill on pt's amphetamine-dextroamphetamine (ADDERALL XR) 10 MG 24 hr capsule  [facility] [facility] [address] - [place], [place] - [address]

## 2018-11-27 ENCOUNTER — Other Ambulatory Visit: Payer: Self-pay

## 2018-11-27 ENCOUNTER — Ambulatory Visit (INDEPENDENT_AMBULATORY_CARE_PROVIDER_SITE_OTHER): Payer: Medicare Other | Admitting: Neurology

## 2018-11-27 ENCOUNTER — Encounter: Payer: Self-pay | Admitting: Neurology

## 2018-11-27 VITALS — BP 143/81 | HR 69 | Temp 97.8°F | Ht 62.75 in

## 2018-11-27 DIAGNOSIS — F329 Major depressive disorder, single episode, unspecified: Secondary | ICD-10-CM

## 2018-11-27 DIAGNOSIS — R4189 Other symptoms and signs involving cognitive functions and awareness: Secondary | ICD-10-CM | POA: Diagnosis not present

## 2018-11-27 DIAGNOSIS — G35 Multiple sclerosis: Secondary | ICD-10-CM | POA: Diagnosis not present

## 2018-11-27 DIAGNOSIS — R350 Frequency of micturition: Secondary | ICD-10-CM | POA: Diagnosis not present

## 2018-11-27 DIAGNOSIS — R260 Ataxic gait: Secondary | ICD-10-CM

## 2018-11-27 DIAGNOSIS — F32A Depression, unspecified: Secondary | ICD-10-CM

## 2018-11-27 NOTE — Progress Notes (Signed)
[facility]  PATIENT: Elaine Wells DOB: 22-Jan-1941  REFERRING CLINICIAN: Cyndy Freeze  HISTORY FROM: Patient  REASON FOR VISIT: MS   HISTORICAL  CHIEF COMPLAINT:  Chief Complaint  Patient presents with  . Follow-up    RM 12. Last seen 05/03/2018.     HISTORY OF PRESENT ILLNESS:  Enyah Nedd is a 78 year old woman who was diagnosed with MS about 20 years ago.   Update 11/27/2018: She has been mostly stable.    She is not on any DMT.   LAst MRi was stable (mild worsening atrophy).  She has periventricular > subcortical foci.    She is walking without a cane though was doing better last year.   She is walking many days and could do a half mile before she feels weaker.    She can go up and down stairs.  No weakness, numbness or dysesthesias.  She has urinary urgency but not incontinence.      She is not outwardly depressed but there is still a lot of irritability.       She sees Dr. Lyn Hollingshead for psychiatry.    She is on fluoxetine and risperdal.     She is forgetful (short term memory).   She was placed on memantine.    I added Adderall for her focus/attention and she has done better, .   She has apathy.  She spends a lot of time watching TV.     Sometimes she is irritable   No recent paraoia  Update 05/03/2018: For the most part, she is stable but cognitive issues are worse.       Gait is unchanged.   She can climb stairs without much issue.  She holds the bannister.   No change in strength or sensation.    She has urge incontinence helped a lot by oxybutynin.  We discussed tradeoff of bladder vs. Cognition and beenfit of oxybutynin is large so they would like to continue.     Vision is doing the same.   She is having more cognitive issues.   She doesn't remember things from earlier in the day.     She has some fatigue.   Depression is doing better on Prozac.  Update 08/22/2017: She feels her MS is stable off of a DMT.   The MRI of the brain performed 03/11/2017 does  not show any new MS lesions performed about 18 months earlier.  There is slight progression of the atrophy though this is generalized.  Physically, she continues to have a mild gait disturbance but does not need to use a cane.  There is no significant numbness or weakness.  She has urinary frequency and urgency but no incontinence.  She reports mild cognitive issues including reduced STM and verbal fluency.  Although Maleeha denies depression, she is more irritable.  Additionally, she has a lot more apathy.   She no longer reads for fun.    She no longer drives. .   She had an episode where she exploded at her son and he took her to the ED.   Per her daughter, she was a little paranoid at that time.  Risperdal 0.5 mg 3 times daily was initially added but that made her too sleepy and she is now on 0.5 mg once a day with benefit.  Namenda was added to the donepezil.   Update 02/23/2017:   She has had MS for over 20 years. She stopped disease modifying therapies around 2015 and  the MRIs of the brain's have not shown any new MS activity. There are no significant changes with gait, strength or sensation.    There has been more difficulty with memory over the past few months. She feels she is the same as last year but her daughter is noting more issues with short term memory.  She took out money form her bank several times but didn't remember going.   She used to read but no longer does so.   She occasionally forgets doing some things like putting on seatbelt.  Of note, she was complaining of some difficulty with memory at her last visit in 2017. She had been on Aricept but stopped because she did not think there was any benefit. She is back on Aricept but is not sure how long. It does not seem to be helping..  She has had more anxiety but no depression.    She sleeps well.  No waking up gasping. I reviewed her last 2 MRIs. There was no new MS activity there was very slight increase in atrophy on her last MRI  compared to the previous one.  We had a long discussion about her cognitive problems are MS that she could have a second process such as Alzheimer's disease.   From 08/24/2016: She has been off medications x 4 years.   At her last visit, she was noting a lot more cognitive issues but she feels she is doing better again.    Bladder and sleep issues are bothering her more.  Gait/strength/sensation:   She notes that her gait is mildly off but not worsening.   She sometimes uses a cane.    Gait is better since left tkr.   No recent falls.    No weakness or numbness.    [name] notes right > left hip pain.     Vision:  Vision is fine.   She never had any MS related vision problems.   She feels vision improved after she has had cataract surgery in the past  Bladder:   She notes urinary frequency and urgency are worse since stopping Vesicare.  She has nocturia only 2 times nightly.    She does not have hesitancy  and she empties her bladder well.   No bowel incontinence.  Fatigue/sleep:   She has physical > cognitive fatigue that is worse as the day goes on.  This is worse with heat.  She falls asleep easily but wakes up a lot (at least twice for bladder).  She snores but nobody has ever told her that she gasps or stops breathing at night.   She does not have excessive daytime sleepiness.     Cognition/Mood:   She feels cognition is better than last visit and she feels STM is better.   She never got any benefit with Aricept and stopped.    She stopped Vesicare and feels STM may nbe a little better   MS History:   About 22-23 years ago,  She had numbness in the left arm and also other episodes of numbness in the legs. However, an MRI was not done when she presented at that time and since she was having a lot of stress it was felt that there could've been psychologic reasons to her numbness.  A few years later , she had an MRI and was diagnosed with MS. She was initially placed on Betaseron but has not been on  Betaseron or other disease modifying therapy for the past 6  years or so. She was seeing Dr. Jacqulynn Cadet until he moved to [place].    She continued to be seen at [facility] and was seen Dr. Shelia Media.   She reports being told that her MS lesions were "dried up" and that she did not need to be on a medication.    REVIEW OF SYSTEMS:  Constitutional: No fevers, chills, sweats, or change in appetite.  She has fatigued and poor sleep Eyes: No visual changes, double vision, eye pain Ear, nose and throat: No hearing loss, ear pain, nasal congestion, sore throat Cardiovascular: No chest pain, palpitations Respiratory:  No shortness of breath at rest or with exertion.   No wheezes.   She snores GastrointestinaI: No nausea, vomiting, diarrhea, abdominal pain, fecal incontinence Genitourinary:  see above.    Musculoskeletal:  Notes R hip pain Integumentary: No rash, pruritus, skin lesions Neurological: as above Psychiatric: Mild depression and anxiety Endocrine: No palpitations, diaphoresis, change in appetite, change in weigh or increased thirst Hematologic/Lymphatic:  No anemia, purpura, petechiae. Allergic/Immunologic: No itchy/runny eyes, nasal congestion, recent allergic reactions, rashes  ALLERGIES: No Known Allergies  HOME MEDICATIONS: Outpatient Medications Prior to Visit  Medication Sig Dispense Refill  . amLODipine (NORVASC) 10 MG tablet Take 10 mg by mouth daily. For HTN    . amphetamine-dextroamphetamine (ADDERALL XR) 10 MG 24 hr capsule Take 1 capsule daily 30 capsule 0  . FLUoxetine (PROZAC) 20 MG capsule Take 20 mg by mouth daily. For depression    . FLUZONE HIGH-DOSE 0.5 ML SUSY TO BE ADMINISTERED BY PHARMACIST FOR IMMUNIZATION  0  . memantine (NAMENDA) 10 MG tablet Take 1 tablet by mouth 2 (two) times daily.    . metoprolol succinate (TOPROL-XL) 25 MG 24 hr tablet     . oxybutynin (DITROPAN) 5 MG tablet Take 1 tablet by mouth 2 (two) times daily.    . risperiDONE (RISPERDAL) 0.5 MG  tablet Take by mouth.    . donepezil (ARICEPT) 10 MG tablet Take 1 tablet (10 mg total) by mouth at bedtime. 30 tablet 11   No facility-administered medications prior to visit.     PAST MEDICAL HISTORY: Past Medical History:  Diagnosis Date  . Depression   . Diabetes mellitus    TYPE 2  . Hyperlipidemia   . Hypertension   . Memory loss   . MS (multiple sclerosis) ([place])   . OAB (overactive bladder)   . Osteoarthritis of left knee   . Vision abnormalities     PAST SURGICAL HISTORY: Past Surgical History:  Procedure Laterality Date  . CATARACT EXTRACTION    . COLONOSCOPY W/ POLYPECTOMY  2005  . 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 Diabetes Mother   . Hypertension Mother   . Cancer Mother        COLON  . Breast cancer Mother   . Diabetes Maternal Grandmother   . Diabetes Paternal Grandmother   . Cancer Father        PROSTATE AND LUNG    SOCIAL HISTORY:  Social History   Socioeconomic History  . Marital status: Widowed    Spouse name: Not on file  . Number of children: Not on file  . Years of education: Not on file  . Highest education level: Not on file  Occupational History  . Not on file  Social Needs  . Financial resource strain: Not on file  . Food insecurity  Worry: Not on file    Inability: Not on file  . Transportation needs    Medical: Not on file    Non-medical: Not on file  Tobacco Use  . Smoking status: Former Smoker    Years: 20.00    Types: Cigarettes    Quit date: 02/28/1988    Years since quitting: 30.7  . Smokeless tobacco: Never Used  Substance and Sexual Activity  . Alcohol use: No    Alcohol/week: 0.0 standard drinks    Comment: rare  . Drug use: No  . Sexual activity: Never    Comment: 1st intercourse- 17, partners- 2  Lifestyle  . Physical activity    Days per week: Not on file    Minutes per session: Not on file  . Stress: Not on file  Relationships   . Social Musicianconnections    Talks on phone: Not on file    Gets together: Not on file    Attends religious service: Not on file    Active member of club or organization: Not on file    Attends meetings of clubs or organizations: Not on file    Relationship status: Not on file  . Intimate partner violence    Fear of current or ex partner: Not on file    Emotionally abused: Not on file    Physically abused: Not on file    Forced sexual activity: Not on file  Other Topics Concern  . Not on file  Social History Narrative  . Not on file     PHYSICAL EXAM  Vitals:   11/27/18 1504  BP: (!) 143/81  Pulse: 69  Temp: 97.8 F (36.6 C)  SpO2: 96%  Height: 5' 2.75" (1.594 m)      General: The patient is well-developed and well-nourished and in no acute distress.   She has a positive right Faber sign.   Mildly tender over right trochanteric bursa.     Neurologic Exam  Mental status: The patient is alert and oriented x 3 at the time of the examination.  Focus and attention is reduced.  Short-term memory is reduced.  Speech is normal.  Cranial nerves: Extraocular movements are full.  Facial strength is normal.  Trapezius strength is normal.  No obvious hearing deficits are noted.  Motor:  Muscle bulk is normal and tone is slightly increased in legs.  Strength is 5/5 in the arms and legs..   Sensory: She has intact sensation to touch and vibration in all 4 extremities  Coordination: Cerebellar testing reveals good finger-nose-finger and mildly reduced heel-to-shin bilaterally.  Gait and station: Station is normal with eyes open and closed.  The gait is mildly wide.  Tandem gait is wide.  Romberg is negative.  Her turns are unbalanced.  Reflexes: Deep tendon reflexes are symmetric and brisk bilaterally.      DIAGNOSTIC DATA (LABS, IMAGING, TESTING) - I reviewed patient records, labs, notes, testing and imaging myself where available.      ASSESSMENT AND PLAN   MULTIPLE  SCLEROSIS  Urinary frequency  Depression, unspecified depression type  Cognitive impairment  Ataxic gait   1.    She will continue all her other disease modifying therapy.  Neurologically she is stable.  Her last MRI earlier 2020 was unchanged.  She has white matter changes in the hemispheres but not elsewhere.  There is mild atrophy.   2.    Memory loss is becoming more of a problem combined with other cognitive issues.  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  She will continue donepezil and Namenda.    3.   Continue Adderall XR 10 mg daily.  She will also continue medications prescribed by psychiatry. 4.  We have discussed that oxybutynin might worsen cognition.  However, he believes the benefits are worse than any change that she noted when she tried to go off the medication. 5.   She will return to see me in 6 months or sooner if there are new or worsening neurologic symptoms.   Richard A. Epimenio FootSater, MD, PhD 11/27/2018, 5:58 PM Certified in Neurology, Clinical Neurophysiology, Sleep Medicine, Pain Medicine and Neuroimaging  [facility] [address], [address] [place], [place] [address] [phone]

## 2018-12-20 ENCOUNTER — Other Ambulatory Visit: Payer: Self-pay | Admitting: Neurology

## 2018-12-20 MED ORDER — AMPHETAMINE-DEXTROAMPHET ER 10 MG PO CP24: ORAL_CAPSULE | ORAL | 0 refills | Status: DC

## 2018-12-20 NOTE — Telephone Encounter (Signed)
I was paged today at 2pm (not sure why) for a refil of meds amphetamine. Daughter (not patient) called thanks

## 2018-12-20 NOTE — Addendum Note (Signed)
Addended by: Hope Pigeon on: 12/20/2018 03:34 PM   Modules accepted: Orders

## 2019-01-24 ENCOUNTER — Telehealth: Payer: Self-pay | Admitting: Neurology

## 2019-01-24 MED ORDER — AMPHETAMINE-DEXTROAMPHET ER 10 MG PO CP24: ORAL_CAPSULE | ORAL | 0 refills | Status: DC

## 2019-01-24 NOTE — Telephone Encounter (Signed)
Pts daughter returned call please call back

## 2019-01-24 NOTE — Telephone Encounter (Signed)
I called [facility] about pts daughter calling about medications cant be filled. THe pharmacist tech stated they can refill it but they had to order the  medication because they are out of stock. She stated daughter was told it could be here tomorrow because that's the expected date. The pharmacist tech research [facility] locations in [place], [place] and [place] and they dont have the [employer] in [place]. I stated daughter will be call.

## 2019-01-24 NOTE — Telephone Encounter (Signed)
Left vm for patients daughter to call back about addreall.

## 2019-01-24 NOTE — Telephone Encounter (Addendum)
Pt daughter has called to say that previously they requested the refill on the 28th would pick it up on the 29th or 30th.  They are now being told pt can not get the medication before 11-02.  Daughter states pt can not be without the amphetamine-dextroamphetamine (ADDERALL XR) 10 MG 24 hr capsule sent for any time because of how much it helps her.  Please call

## 2019-01-24 NOTE — Addendum Note (Signed)
Addended by: Britt Bottom on: 01/24/2019 01:33 PM   Modules accepted: Orders

## 2019-01-24 NOTE — Telephone Encounter (Signed)
Pt is needing a refill on her amphetamine-dextroamphetamine (ADDERALL XR) 10 MG 24 hr capsule sent to the [facility] [address]

## 2019-01-24 NOTE — Telephone Encounter (Signed)
Message sent to Dr. Felecia Shelling to refill adderall.

## 2019-01-24 NOTE — Telephone Encounter (Signed)
I called Elaine Wells that the pharmacy is on back order with the adderall XR. I stated they have to order and it should be there tomorrow. The daughter will contact the pharmacist tomorrow to see if its in I stated other [facility] were check by the pharmacist and they were on back order too. I advise daughter that she may have to inquire about another pharmacy if it does not come timely. I stated we are closed tomorrow but the work in [place] is here to assist. The daughter verbalized understanding.

## 2019-02-22 ENCOUNTER — Telehealth: Payer: Self-pay | Admitting: Neurology

## 2019-02-22 MED ORDER — AMPHETAMINE-DEXTROAMPHET ER 10 MG PO CP24: ORAL_CAPSULE | ORAL | 0 refills | Status: DC

## 2019-02-22 NOTE — Telephone Encounter (Signed)
Daughter called that she will run out of Adderall.   A new prescription wasen sent in

## 2019-03-12 NOTE — Telephone Encounter (Signed)
error 

## 2019-03-25 ENCOUNTER — Other Ambulatory Visit: Payer: Self-pay | Admitting: Neurology

## 2019-03-25 MED ORDER — AMPHETAMINE-DEXTROAMPHET ER 10 MG PO CP24: ORAL_CAPSULE | ORAL | 0 refills | Status: DC

## 2019-03-25 NOTE — Telephone Encounter (Signed)
Pt is requesting a refill of amphetamine-dextroamphetamine (ADDERALL XR) 10 MG 24 hr capsule, to be sent to [place], [place] - [address]

## 2019-04-22 ENCOUNTER — Other Ambulatory Visit: Payer: Self-pay | Admitting: Neurology

## 2019-04-22 MED ORDER — AMPHETAMINE-DEXTROAMPHET ER 10 MG PO CP24: ORAL_CAPSULE | ORAL | 0 refills | Status: DC

## 2019-04-22 NOTE — Telephone Encounter (Signed)
Pt is needing a refill on her amphetamine-dextroamphetamine (ADDERALL XR) 10 MG 24 hr capsule sent in to the [facility] in [place] 

## 2019-05-23 ENCOUNTER — Other Ambulatory Visit: Payer: Self-pay | Admitting: Neurology

## 2019-05-23 MED ORDER — AMPHETAMINE-DEXTROAMPHET ER 10 MG PO CP24: ORAL_CAPSULE | ORAL | 0 refills | Status: DC

## 2019-05-23 NOTE — Telephone Encounter (Signed)
1) Medication(s) Requested (by name): amphetamine-dextroamphetamine (ADDERALL XR) 10 MG 24 hr capsule   2) Pharmacy of Choice: [facility] [address] [place], [place] - [address]  [address] [place] [address]

## 2019-05-28 ENCOUNTER — Encounter: Payer: Self-pay | Admitting: Family Medicine

## 2019-05-28 ENCOUNTER — Other Ambulatory Visit: Payer: Self-pay

## 2019-05-28 ENCOUNTER — Ambulatory Visit: Payer: Medicare Other | Admitting: Family Medicine

## 2019-05-28 VITALS — BP 126/71 | HR 58 | Temp 97.3°F | Ht 64.0 in | Wt 173.2 lb

## 2019-05-28 DIAGNOSIS — R5382 Chronic fatigue, unspecified: Secondary | ICD-10-CM | POA: Diagnosis not present

## 2019-05-28 DIAGNOSIS — G35 Multiple sclerosis: Secondary | ICD-10-CM | POA: Diagnosis not present

## 2019-05-28 DIAGNOSIS — R4189 Other symptoms and signs involving cognitive functions and awareness: Secondary | ICD-10-CM

## 2019-05-28 DIAGNOSIS — F329 Major depressive disorder, single episode, unspecified: Secondary | ICD-10-CM

## 2019-05-28 NOTE — Progress Notes (Signed)
PATIENT: Elaine Wells DOB: 1940-05-31  REASON FOR VISIT: follow up HISTORY FROM: patient  Chief Complaint  Patient presents with  . Follow-up    RM2. with husband. states that she is doing well and doesnt have any concerns.     HISTORY OF PRESENT ILLNESS: Today 05/30/19 Elaine Wells is a 79 y.o. female here today for follow up for MS. Not on DMT. She feels that she is doing well overall. She presents today with her son who also feels she is doing well. No changes in vision, gait, bowel or bladder habits. No weakness or numbness. She continues to follow up closely with psychiatry. She does endorse depression but feels she is well managed at this time. She is excited about warmer weather. She enjoys walking at the park. No falls.   HISTORY: (copied from Dr Bonnita Hollow note on 11/27/2018)  Elaine Wells is a 79 year old woman who was diagnosed with MS about 20 years ago.   Update 11/27/2018: She has been mostly stable.    She is not on any DMT.   LAst MRi was stable (mild worsening atrophy).  She has periventricular > subcortical foci.    She is walking without a cane though was doing better last year.   She is walking many days and could do a half mile before she feels weaker.    She can go up and down stairs.  No weakness, numbness or dysesthesias.  She has urinary urgency but not incontinence.      She is not outwardly depressed but there is still a lot of irritability.       She sees Dr. Lyn Hollingshead for psychiatry.    She is on fluoxetine and risperdal.     She is forgetful (short term memory).   She was placed on memantine.    I added Adderall for her focus/attention and she has done better, .   She has apathy.  She spends a lot of time watching TV.     Sometimes she is irritable   No recent paraoia  Update 05/03/2018: For the most part, she is stable but cognitive issues are worse.       Gait is unchanged.   She can climb stairs without much issue.  She holds the bannister.   No change in  strength or sensation.    She has urge incontinence helped a lot by oxybutynin.  We discussed tradeoff of bladder vs. Cognition and beenfit of oxybutynin is large so they would like to continue.     Vision is doing the same.   She is having more cognitive issues.   She doesn't remember things from earlier in the day.     She has some fatigue.   Depression is doing better on Prozac.  Update 08/22/2017: She feels her MS is stable off of a DMT.   The MRI of the brain performed 03/11/2017 does not show any new MS lesions performed about 18 months earlier.  There is slight progression of the atrophy though this is generalized.  Physically, she continues to have a mild gait disturbance but does not need to use a cane.  There is no significant numbness or weakness.  She has urinary frequency and urgency but no incontinence.  She reports mild cognitive issues including reduced STM and verbal fluency.  Although Elaine Wells denies depression, she is more irritable.  Additionally, she has a lot more apathy.   She no longer reads for fun.  She no longer drives. .   She had an episode where she exploded at her son and he took her to the ED.   Per her daughter, she was a little paranoid at that time.  Risperdal 0.5 mg 3 times daily was initially added but that made her too sleepy and she is now on 0.5 mg once a day with benefit.  Namenda was added to the donepezil.   Update 02/23/2017:   She has had MS for over 20 years. She stopped disease modifying therapies around 2015 and the MRIs of the brain's have not shown any new MS activity. There are no significant changes with gait, strength or sensation.    There has been more difficulty with memory over the past few months. She feels she is the same as last year but her daughter is noting more issues with short term memory.  She took out money form her bank several times but didn't remember going.   She used to read but no longer does so.   She occasionally forgets  doing some things like putting on seatbelt.  Of note, she was complaining of some difficulty with memory at her last visit in 2017. She had been on Aricept but stopped because she did not think there was any benefit. She is back on Aricept but is not sure how long. It does not seem to be helping..  She has had more anxiety but no depression.    She sleeps well.  No waking up gasping. I reviewed her last 2 MRIs. There was no new MS activity there was very slight increase in atrophy on her last MRI compared to the previous one.  We had a long discussion about her cognitive problems are MS that she could have a second process such as Alzheimer's disease.   From 08/24/2016: She has been off medications x 4 years.   At her last visit, she was noting a lot more cognitive issues but she feels she is doing better again.    Bladder and sleep issues are bothering her more.  Gait/strength/sensation:   She notes that her gait is mildly off but not worsening.   She sometimes uses a cane.    Gait is better since left tkr.   No recent falls.    No weakness or numbness.    [name] notes right > left hip pain.     Vision:  Vision is fine.   She never had any MS related vision problems.   She feels vision improved after she has had cataract surgery in the past  Bladder:   She notes urinary frequency and urgency are worse since stopping Vesicare.  She has nocturia only 2 times nightly.    She does not have hesitancy  and she empties her bladder well.   No bowel incontinence.  Fatigue/sleep:   She has physical > cognitive fatigue that is worse as the day goes on.  This is worse with heat.  She falls asleep easily but wakes up a lot (at least twice for bladder).  She snores but nobody has ever told her that she gasps or stops breathing at night.   She does not have excessive daytime sleepiness.     Cognition/Mood:   She feels cognition is better than last visit and she feels STM is better.   She never got any  benefit with Aricept and stopped.    She stopped Vesicare and feels STM may nbe a little better  MS History:   About 22-23 years ago,  She had numbness in the left arm and also other episodes of numbness in the legs. However, an MRI was not done when she presented at that time and since she was having a lot of stress it was felt that there could've been psychologic reasons to her numbness.  A few years later , she had an MRI and was diagnosed with MS. She was initially placed on Betaseron but has not been on Betaseron or other disease modifying therapy for the past 6 years or so. She was seeing Dr. Leotis Shames until he moved to [place].    She continued to be seen at [facility] and was seen Dr. Renne Crigler.   She reports being told that her MS lesions were "dried up" and that she did not need to be on a medication.   REVIEW OF SYSTEMS: Out of a complete 14 system review of symptoms, the patient complains only of the following symptoms, depression and all other reviewed systems are negative.  ALLERGIES: No Known Allergies  HOME MEDICATIONS: Outpatient Medications Prior to Visit  Medication Sig Dispense Refill  . amLODipine (NORVASC) 10 MG tablet Take 10 mg by mouth daily. For HTN    . amphetamine-dextroamphetamine (ADDERALL XR) 10 MG 24 hr capsule Take 1 capsule daily 30 capsule 0  . FLUoxetine (PROZAC) 20 MG capsule Take 20 mg by mouth daily. For depression    . FLUZONE HIGH-DOSE 0.5 ML SUSY TO BE ADMINISTERED BY PHARMACIST FOR IMMUNIZATION  0  . memantine (NAMENDA) 10 MG tablet Take 1 tablet by mouth 2 (two) times daily.    . metoprolol succinate (TOPROL-XL) 25 MG 24 hr tablet     . oxybutynin (DITROPAN) 5 MG tablet Take 1 tablet by mouth 2 (two) times daily.    . risperiDONE (RISPERDAL) 0.5 MG tablet Take by mouth.     No facility-administered medications prior to visit.    PAST MEDICAL HISTORY: Past Medical History:  Diagnosis Date  . Depression   . Diabetes mellitus    TYPE 2  .  Hyperlipidemia   . Hypertension   . Memory loss   . MS (multiple sclerosis) (HCC)   . OAB (overactive bladder)   . Osteoarthritis of left knee   . Vision abnormalities     PAST SURGICAL HISTORY: Past Surgical History:  Procedure Laterality Date  . CATARACT EXTRACTION    . COLONOSCOPY W/ POLYPECTOMY  2005  . 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 Diabetes Mother   . Hypertension Mother   . Cancer Mother        COLON  . Breast cancer Mother   . Diabetes Maternal Grandmother   . Diabetes Paternal Grandmother   . Cancer Father        PROSTATE AND LUNG    SOCIAL HISTORY: Social History   Socioeconomic History  . Marital status: Widowed    Spouse name: Not on file  . Number of children: Not on file  . Years of education: Not on file  . Highest education level: Not on file  Occupational History  . Not on file  Tobacco Use  . Smoking status: Former Smoker    Years: 20.00    Types: Cigarettes    Quit date: 02/28/1988    Years since quitting: 31.2  . Smokeless tobacco: Never Used  Substance and Sexual Activity  . Alcohol use: No  Alcohol/week: 0.0 standard drinks    Comment: rare  . Drug use: No  . Sexual activity: Never    Comment: 1st intercourse- 17, partners- 2  Other Topics Concern  . Not on file  Social History Narrative  . Not on file   Social Determinants of Health   Financial Resource Strain:   . Difficulty of Paying Living Expenses: Not on file  Food Insecurity:   . Worried About Charity fundraiser in the Last Year: Not on file  . Ran Out of Food in the Last Year: Not on file  Transportation Needs:   . Lack of Transportation (Medical): Not on file  . Lack of Transportation (Non-Medical): Not on file  Physical Activity:   . Days of Exercise per Week: Not on file  . Minutes of Exercise per Session: Not on file  Stress:   . Feeling of Stress : Not on file  Social Connections:    . Frequency of Communication with Friends and Family: Not on file  . Frequency of Social Gatherings with Friends and Family: Not on file  . Attends Religious Services: Not on file  . Active Member of Clubs or Organizations: Not on file  . Attends Archivist Meetings: Not on file  . Marital Status: Not on file  Intimate Partner Violence:   . Fear of Current or Ex-Partner: Not on file  . Emotionally Abused: Not on file  . Physically Abused: Not on file  . Sexually Abused: Not on file      PHYSICAL EXAM  Vitals:   05/28/19 1337  BP: 126/71  Pulse: (!) 58  Temp: (!) 97.3 F (36.3 C)  Weight: 173 lb 3.2 oz (78.6 kg)  Height: 5\' 4"  (1.626 m)   Body mass index is 29.73 kg/m.  Generalized: Well developed, in no acute distress  Cardiology: normal rate and rhythm, no murmur noted Neurological examination  Mentation: Alert oriented to time, place, history taking. Follows all commands speech and language fluent Cranial nerve II-XII: Pupils were equal round reactive to light. Extraocular movements were full, visual field were full on confrontational test. Facial sensation and strength were normal. Uvula tongue midline. Head turning and shoulder shrug  were normal and symmetric. Motor: The motor testing reveals 5 over 5 strength of all 4 extremities. Good symmetric motor tone is noted throughout.  Sensory: Sensory testing is intact to soft touch on all 4 extremities. No evidence of extinction is noted.  Coordination: Cerebellar testing reveals good finger-nose-finger and heel-to-shin bilaterally.  Gait and station: Gait is slightly wide but stable  Reflexes: Deep tendon reflexes are symmetric and normal bilaterally.   DIAGNOSTIC DATA (LABS, IMAGING, TESTING) - I reviewed patient records, labs, notes, testing and imaging myself where available.  No flowsheet data found.   No results found for: WBC, HGB, HCT, MCV, PLT    Component Value Date/Time   NA 142 02/23/2017 1610     K 4.3 02/23/2017 1610   CL 102 02/23/2017 1610   CO2 27 02/23/2017 1610   GLUCOSE 130 (H) 02/23/2017 1610   BUN 17 02/23/2017 1610   CREATININE 0.94 02/23/2017 1610   CALCIUM 10.1 02/23/2017 1610   PROT 7.0 02/23/2017 1610   ALBUMIN 4.4 02/23/2017 1610   AST 28 02/23/2017 1610   ALT 16 02/23/2017 1610   ALKPHOS 98 02/23/2017 1610   BILITOT 0.3 02/23/2017 1610   GFRNONAA 59 (L) 02/23/2017 1610   GFRAA 68 02/23/2017 1610   No results found  for: CHOL, HDL, LDLCALC, LDLDIRECT, TRIG, CHOLHDL No results found for: WGNF6O Lab Results  Component Value Date   VITAMINB12 260 02/23/2017   Lab Results  Component Value Date   TSH 2.050 02/23/2017     ASSESSMENT AND PLAN 79 y.o. year old female  has a past medical history of Depression, Diabetes mellitus, Hyperlipidemia, Hypertension, Memory loss, MS (multiple sclerosis) (HCC), OAB (overactive bladder), Osteoarthritis of left knee, and Vision abnormalities. here with     ICD-10-CM   1. Relapsing remitting multiple sclerosis (HCC)  G35   2. Major depression, chronic  F32.9   3. Chronic fatigue  R53.82   4. Cognitive impairment  R41.89     Aliha is doing well today.  MS symptoms are stable.  She feels that cognitive impairment and depression are also very stable.  She will continue close follow-up with psychiatry.  She will continue Adderall 10 mg daily for MS fatigue and help with concentration.  Patient has had appropriate refills.  Last refilled on May 23, 2019.  I have encouraged regular activity.  Well-balanced diet and adequate fluid intake encouraged.  She will follow-up in 6 months, sooner if needed.  She verbalizes understanding and agreement with this plan.   No orders of the defined types were placed in this encounter.    No orders of the defined types were placed in this encounter.     I spent 15 minutes with the patient. 50% of this time was spent counseling and educating patient on plan of care and medications.     Shawnie Dapper, FNP-C 05/30/2019, 1:04 PM [facility] [address] [place], [place] [address] [phone]

## 2019-05-28 NOTE — Patient Instructions (Signed)
  We will continue current treatment plan  Follow up in 6 months   Memory Compensation Strategies  1. Use "WARM" strategy.  W= write it down  A= associate it  R= repeat it  M= make a mental note  2.   You can keep a Glass blower/designer.  Use a 3-ring notebook with sections for the following: calendar, important names and phone numbers,  medications, doctors' names/phone numbers, lists/reminders, and a section to journal what you did  each day.   3.    Use a calendar to write appointments down.  4.    Write yourself a schedule for the day.  This can be placed on the calendar or in a separate section of the Memory Notebook.  Keeping a  regular schedule can help memory.  5.    Use medication organizer with sections for each day or morning/evening pills.  You may need help loading it  6.    Keep a basket, or pegboard by the door.  Place items that you need to take out with you in the basket or on the pegboard.  You may also want to  include a message board for reminders.  7.    Use sticky notes.  Place sticky notes with reminders in a place where the task is performed.  For example: " turn off the  stove" placed by the stove, "lock the door" placed on the door at eye level, " take your medications" on  the bathroom mirror or by the place where you normally take your medications.  8.    Use alarms/timers.  Use while cooking to remind yourself to check on food or as a reminder to take your medicine, or as a  reminder to make a call, or as a reminder to perform another task, etc.

## 2019-05-30 ENCOUNTER — Encounter: Payer: Self-pay | Admitting: Family Medicine

## 2019-05-30 NOTE — Progress Notes (Signed)
I have read the note, and I agree with the clinical assessment and plan.  Richard A. Sater, MD, PhD, FAAN Certified in Neurology, Clinical Neurophysiology, Sleep Medicine, Pain Medicine and Neuroimaging  [facility] [address], [address] [place], [place] [address] [phone]  

## 2019-06-21 ENCOUNTER — Telehealth: Payer: Self-pay | Admitting: Family Medicine

## 2019-06-21 MED ORDER — AMPHETAMINE-DEXTROAMPHET ER 10 MG PO CP24: ORAL_CAPSULE | ORAL | 0 refills | Status: DC

## 2019-06-21 NOTE — Telephone Encounter (Signed)
Filled and send Adderall 10 mg to Pharmacy of record. CD

## 2019-06-21 NOTE — Telephone Encounter (Signed)
Pt's daughter Orpah Melter) is asking for a refill on pt's amphetamine-dextroamphetamine (ADDERALL XR) 10 MG 24 hr capsule.  Pt is down to 1 pill  [address] [place] [phone]

## 2019-06-21 NOTE — Addendum Note (Signed)
Addended by: Melvyn Novas on: 06/21/2019 09:13 AM   Modules accepted: Orders

## 2019-07-19 ENCOUNTER — Other Ambulatory Visit (HOSPITAL_COMMUNITY): Payer: Self-pay | Admitting: Neurology

## 2019-07-19 NOTE — Progress Notes (Signed)
I returned a phone call from the patient's daughter who stated that her mother was out of Adderall and needed a refill.  I explained to her that I could not order it from home as it was restricted prescription and needed my identity verification which was possible only in the office.  Since the office was closed for the weekend and she will have to wait till Monday.

## 2019-07-22 ENCOUNTER — Other Ambulatory Visit: Payer: Self-pay | Admitting: Neurology

## 2019-07-22 MED ORDER — AMPHETAMINE-DEXTROAMPHET ER 10 MG PO CP24: ORAL_CAPSULE | ORAL | 0 refills | Status: DC

## 2019-07-22 NOTE — Progress Notes (Signed)
Please let them know I sent this in

## 2019-07-22 NOTE — Progress Notes (Signed)
Called daughter, Adele Schilder, back at [phone]. LVM letting her know Adderall was sent in by Dr. Epimenio Foot.

## 2019-08-20 ENCOUNTER — Telehealth: Payer: Self-pay | Admitting: Family Medicine

## 2019-08-20 ENCOUNTER — Telehealth: Payer: Self-pay

## 2019-08-20 NOTE — Telephone Encounter (Signed)
Daughter presented to the lobby today and updated the DPR. She is hoping to get medication refills so she does not have to bring her mother in so early tomorrow morning. Best call back is [phone]

## 2019-08-20 NOTE — Telephone Encounter (Signed)
See telephone note from 08/20/19.

## 2019-08-20 NOTE — Progress Notes (Deleted)
PATIENT: Elaine Wells DOB: 1941-03-08  REASON FOR VISIT: follow up HISTORY FROM: patient  No chief complaint on file.    HISTORY OF PRESENT ILLNESS: Today 08/20/19 Elaine Wells is a 79 y.o. female here today for follow up.   HISTORY: (copied from my note on 05/28/2019)  Elaine Wells is a 79 y.o. female here today for follow up for MS. Not on DMT. She feels that she is doing well overall. She presents today with her son who also feels she is doing well. No changes in vision, gait, bowel or bladder habits. No weakness or numbness. She continues to follow up closely with psychiatry. She does endorse depression but feels she is well managed at this time. She is excited about warmer weather. She enjoys walking at the park. No falls.   HISTORY: (copied from Dr Garth Bigness note on 11/27/2018)  Elaine Wells is a 79 year old woman who was diagnosed with MS about 20 years ago.  Update 11/27/2018: She has been mostly stable. She is not on any DMT. LAst MRi was stable (mild worsening atrophy). She has periventricular >subcortical foci. She is walking without a cane though was doing better last year. She is walking many days and could do a half mile before she feels weaker. She can go up and down stairs. No weakness, numbness or dysesthesias. She has urinary urgency but not incontinence.   She is not outwardly depressed but there is still a lot of irritability. She sees Dr. Sheppard Coil for psychiatry. She is on fluoxetine and risperdal. She is forgetful (short term memory). She was placed on memantine. I added Adderall for her focus/attention and she has done better, . She has apathy. She spends a lot of time watching TV. Sometimes she is irritable No recent paraoia  Update 05/03/2018: For the most part, she is stable but cognitive issues are worse. Gait is unchanged. She can climb stairs without much issue. She holds the bannister. No change in  strength or sensation. She has urge incontinence helped a lot by oxybutynin. We discussed tradeoff of bladder vs. Cognition and beenfit of oxybutynin is large so they would like to continue. Vision is doing the same. She is having more cognitive issues. She doesn't remember things from earlier in the day. She has some fatigue.   Depression is doing better on Prozac.  Update 08/22/2017: She feels her MS is stable off of a DMT. The MRI of the brain performed 03/11/2017 does not show any new MS lesions performed about 18 months earlier. There is slight progression of the atrophy though this is generalized. Physically, she continues to have a mild gait disturbance but does not need to use a cane. There is no significant numbness or weakness. She has urinary frequency and urgency but no incontinence.  She reports mild cognitive issues including reduced STM and verbal fluency. Although Elaine Wells denies depression, she is more irritable. Additionally, she has a lot more apathy. She no longer reads for fun. She no longer drives. . She had an episode where she exploded at her son and he took her to the ED. Per her daughter, she was a little paranoid at that time. Risperdal 0.5 mg 3 times daily was initially added but that made her too sleepy and she is now on 0.5 mg once a day with benefit. Namenda was added to the donepezil.   Update 02/23/2017:  She has had MS for over 20 years. She stopped disease modifying therapies around 2015 and  the MRIs of the brain's have not shown any new MS activity. There are no significant changes with gait, strength or sensation.   There has been more difficulty with memory over the past few months. She feels she is the same as last year but her daughter is noting more issues with short term memory. She took out money form her bank several times but didn't remember going. She used to read but no longer does so. She occasionally forgets  doing some things like putting on seatbelt. Of note, she was complaining of some difficulty with memory at her last visit in 2017. She had been on Aricept but stopped because she did not think there was any benefit. She is back on Aricept but is not sure how long. It does not seem to be helping..  She has had more anxiety but no depression. She sleeps well. No waking up gasping. I reviewed her last 2 MRIs. There was no new MS activity there was very slight increase in atrophy on her last MRI compared to the previous one.  We had a long discussion about her cognitive problems are MS that she could have a second process such as Alzheimer's disease.   From 08/24/2016: She has been off medications x 4 years. At her last visit, she was noting a lot more cognitive issues but she feels she is doing better again. Bladder and sleep issues are bothering her more.  Gait/strength/sensation: She notes that her gait is mildly off but not worsening. She sometimes uses a cane. Gait is better since left tkr. No recent falls. No weakness or numbness. [name] notes right >left hip pain.   Vision: Vision is fine. She never had any MS related vision problems. She feels vision improved after she has had cataract surgery in the past  Bladder: She notes urinary frequency and urgency are worse since stopping Vesicare. She has nocturia only 2 times nightly. She does not have hesitancy and she empties her bladder well. No bowel incontinence.  Fatigue/sleep: She has physical >cognitive fatigue that is worse as the day goes on. This is worse with heat. She falls asleep easily but wakes up a lot (at least twice for bladder). She snores but nobody has ever told her that she gasps or stops breathing at night. She does not have excessive daytime sleepiness.   Cognition/Mood: She feels cognition is better than last visit and she feels STM is better. She never got any  benefit with Aricept and stopped. She stopped Vesicare and feels STM may nbe a little better   MS History: About 22-23 years ago, She had numbness in the left arm and also other episodes of numbness in the legs. However, an MRI was not done when she presented at that time and since she was having a lot of stress it was felt that there could've been psychologic reasons to her numbness. A few years later , she had an MRI and was diagnosed with MS. She was initially placed on Betaseron but has not been on Betaseron or other disease modifying therapy for the past 6 years or so. She was seeing Dr. Leotis Shames until he moved to [place]. She continued to be seen at [facility] and was seen Dr. Renne Crigler. She reports being told that her MS lesions were "dried up" and that she did not need to be on a medication.   REVIEW OF SYSTEMS: Out of a complete 14 system review of symptoms, the patient complains only of the following  symptoms, and all other reviewed systems are negative.  ALLERGIES: No Known Allergies  HOME MEDICATIONS: Outpatient Medications Prior to Visit  Medication Sig Dispense Refill  . amLODipine (NORVASC) 10 MG tablet Take 10 mg by mouth daily. For HTN    . amphetamine-dextroamphetamine (ADDERALL XR) 10 MG 24 hr capsule Take 1 capsule daily 30 capsule 0  . FLUoxetine (PROZAC) 20 MG capsule Take 20 mg by mouth daily. For depression    . FLUZONE HIGH-DOSE 0.5 ML SUSY TO BE ADMINISTERED BY PHARMACIST FOR IMMUNIZATION  0  . memantine (NAMENDA) 10 MG tablet Take 1 tablet by mouth 2 (two) times daily.    . metoprolol succinate (TOPROL-XL) 25 MG 24 hr tablet     . oxybutynin (DITROPAN) 5 MG tablet Take 1 tablet by mouth 2 (two) times daily.    . risperiDONE (RISPERDAL) 0.5 MG tablet Take by mouth.     No facility-administered medications prior to visit.    PAST MEDICAL HISTORY: Past Medical History:  Diagnosis Date  . Depression   . Diabetes mellitus    TYPE 2  . Hyperlipidemia     . Hypertension   . Memory loss   . MS (multiple sclerosis) (HCC)   . OAB (overactive bladder)   . Osteoarthritis of left knee   . Vision abnormalities     PAST SURGICAL HISTORY: Past Surgical History:  Procedure Laterality Date  . CATARACT EXTRACTION    . COLONOSCOPY W/ POLYPECTOMY  2005  . 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 Diabetes Mother   . Hypertension Mother   . Cancer Mother        COLON  . Breast cancer Mother   . Diabetes Maternal Grandmother   . Diabetes Paternal Grandmother   . Cancer Father        PROSTATE AND LUNG    SOCIAL HISTORY: Social History   Socioeconomic History  . Marital status: Widowed    Spouse name: Not on file  . Number of children: Not on file  . Years of education: Not on file  . Highest education level: Not on file  Occupational History  . Not on file  Tobacco Use  . Smoking status: Former Smoker    Years: 20.00    Types: Cigarettes    Quit date: 02/28/1988    Years since quitting: 31.4  . Smokeless tobacco: Never Used  Substance and Sexual Activity  . Alcohol use: No    Alcohol/week: 0.0 standard drinks    Comment: rare  . Drug use: No  . Sexual activity: Never    Comment: 1st intercourse- 17, partners- 2  Other Topics Concern  . Not on file  Social History Narrative  . Not on file   Social Determinants of Health   Financial Resource Strain:   . Difficulty of Paying Living Expenses:   Food Insecurity:   . Worried About Programme researcher, broadcasting/film/video in the Last Year:   . Barista in the Last Year:   Transportation Needs:   . Freight forwarder (Medical):   Marland Kitchen Lack of Transportation (Non-Medical):   Physical Activity:   . Days of Exercise per Week:   . Minutes of Exercise per Session:   Stress:   . Feeling of Stress :   Social Connections:   . Frequency of Communication with Friends and Family:   . Frequency of Social Gatherings with  Friends  and Family:   . Attends Religious Services:   . Active Member of Clubs or Organizations:   . Attends Banker Meetings:   Marland Kitchen Marital Status:   Intimate Partner Violence:   . Fear of Current or Ex-Partner:   . Emotionally Abused:   Marland Kitchen Physically Abused:   . Sexually Abused:       PHYSICAL EXAM  There were no vitals filed for this visit. There is no height or weight on file to calculate BMI.  Generalized: Well developed, in no acute distress  Cardiology: normal rate and rhythm, no murmur noted Respiratory: clear to auscultation bilaterally  Neurological examination  Mentation: Alert oriented to time, place, history taking. Follows all commands speech and language fluent Cranial nerve II-XII: Pupils were equal round reactive to light. Extraocular movements were full, visual field were full on confrontational test. Facial sensation and strength were normal. Uvula tongue midline. Head turning and shoulder shrug  were normal and symmetric. Motor: The motor testing reveals 5 over 5 strength of all 4 extremities. Good symmetric motor tone is noted throughout.  Sensory: Sensory testing is intact to soft touch on all 4 extremities. No evidence of extinction is noted.  Coordination: Cerebellar testing reveals good finger-nose-finger and heel-to-shin bilaterally.  Gait and station: Gait is normal. Tandem gait is normal. Romberg is negative. No drift is seen.  Reflexes: Deep tendon reflexes are symmetric and normal bilaterally.   DIAGNOSTIC DATA (LABS, IMAGING, TESTING) - I reviewed patient records, labs, notes, testing and imaging myself where available.  No flowsheet data found.   No results found for: WBC, HGB, HCT, MCV, PLT    Component Value Date/Time   NA 142 02/23/2017 1610   K 4.3 02/23/2017 1610   CL 102 02/23/2017 1610   CO2 27 02/23/2017 1610   GLUCOSE 130 (H) 02/23/2017 1610   BUN 17 02/23/2017 1610   CREATININE 0.94 02/23/2017 1610   CALCIUM 10.1  02/23/2017 1610   PROT 7.0 02/23/2017 1610   ALBUMIN 4.4 02/23/2017 1610   AST 28 02/23/2017 1610   ALT 16 02/23/2017 1610   ALKPHOS 98 02/23/2017 1610   BILITOT 0.3 02/23/2017 1610   GFRNONAA 59 (L) 02/23/2017 1610   GFRAA 68 02/23/2017 1610   No results found for: CHOL, HDL, LDLCALC, LDLDIRECT, TRIG, CHOLHDL No results found for: JSEG3T Lab Results  Component Value Date   VITAMINB12 260 02/23/2017   Lab Results  Component Value Date   TSH 2.050 02/23/2017       ASSESSMENT AND PLAN 79 y.o. year old female  has a past medical history of Depression, Diabetes mellitus, Hyperlipidemia, Hypertension, Memory loss, MS (multiple sclerosis) (HCC), OAB (overactive bladder), Osteoarthritis of left knee, and Vision abnormalities. here with ***  No diagnosis found.     No orders of the defined types were placed in this encounter.    No orders of the defined types were placed in this encounter.     I spent 15 minutes with the patient. 50% of this time was spent counseling and educating patient on plan of care and medications.    Shawnie Dapper, FNP-C 08/20/2019, 3:33 PM [facility] [address] [place], [place] [address] [phone]

## 2019-08-20 NOTE — Telephone Encounter (Signed)
Spoke to daughter, she states her mother is doing well and just need med refill  rescheduled her appt for a 6 mo fu

## 2019-08-21 ENCOUNTER — Ambulatory Visit: Payer: Medicare Other | Admitting: Family Medicine

## 2019-08-22 ENCOUNTER — Other Ambulatory Visit: Payer: Self-pay | Admitting: Neurology

## 2019-08-22 MED ORDER — AMPHETAMINE-DEXTROAMPHET ER 10 MG PO CP24: ORAL_CAPSULE | ORAL | 0 refills | Status: DC

## 2019-09-20 ENCOUNTER — Other Ambulatory Visit: Payer: Self-pay | Admitting: Neurology

## 2019-09-20 MED ORDER — AMPHETAMINE-DEXTROAMPHET ER 10 MG PO CP24
ORAL_CAPSULE | ORAL | 0 refills | Status: DC
Start: 2019-09-20 — End: 2019-10-21

## 2019-10-03 NOTE — Telephone Encounter (Signed)
Error

## 2019-10-21 ENCOUNTER — Other Ambulatory Visit: Payer: Self-pay | Admitting: Family Medicine

## 2019-10-21 MED ORDER — AMPHETAMINE-DEXTROAMPHET ER 10 MG PO CP24: ORAL_CAPSULE | ORAL | 0 refills | Status: DC

## 2019-10-21 NOTE — Telephone Encounter (Signed)
Pt is needing a refill on her amphetamine-dextroamphetamine (ADDERALL XR) 10 MG 24 hr capsule sent in to the [facility] in [facility]

## 2019-11-20 ENCOUNTER — Telehealth: Payer: Self-pay | Admitting: Family Medicine

## 2019-11-20 MED ORDER — AMPHETAMINE-DEXTROAMPHET ER 10 MG PO CP24: ORAL_CAPSULE | ORAL | 0 refills | Status: DC

## 2019-11-20 NOTE — Telephone Encounter (Signed)
Pt is needing a refill on her amphetamine-dextroamphetamine (ADDERALL XR) 10 MG 24 hr capsule sent in to the [facility] in [place]

## 2019-11-20 NOTE — Telephone Encounter (Signed)
Requested refill for Adderall XR 10mg   Last refill date 10/21/2019 30 tabs for 30 days ordered by Dr 10/23/2019   Last OV 05/28/19 with 07/28/19 NP  NEXT fu on 12/24/19 with 12/26/19 NP

## 2019-12-23 ENCOUNTER — Other Ambulatory Visit: Payer: Self-pay | Admitting: Family Medicine

## 2019-12-23 MED ORDER — AMPHETAMINE-DEXTROAMPHET ER 10 MG PO CP24: ORAL_CAPSULE | ORAL | 0 refills | Status: DC

## 2019-12-23 NOTE — Addendum Note (Signed)
Addended by: Rosezella Florida on: 12/23/2019 10:29 AM   Modules accepted: Orders

## 2019-12-23 NOTE — Telephone Encounter (Signed)
Pt is requesting a refill for amphetamine-dextroamphetamine (ADDERALL XR) 10 MG 24 hr capsule.  Pharmacy:  [id number]  

## 2019-12-23 NOTE — Telephone Encounter (Signed)
 Drug registry Verified LR:11/20/19 Qty: 30 for 30 days  Last OV:05/28/19 Pending appointment: 12/24/19

## 2019-12-24 ENCOUNTER — Encounter: Payer: Self-pay | Admitting: Family Medicine

## 2019-12-24 ENCOUNTER — Ambulatory Visit: Payer: Medicare Other | Admitting: Family Medicine

## 2019-12-24 VITALS — BP 144/68 | HR 67 | Ht 64.0 in | Wt 168.0 lb

## 2019-12-24 DIAGNOSIS — G35 Multiple sclerosis: Secondary | ICD-10-CM | POA: Diagnosis not present

## 2019-12-24 DIAGNOSIS — R4189 Other symptoms and signs involving cognitive functions and awareness: Secondary | ICD-10-CM | POA: Diagnosis not present

## 2019-12-24 DIAGNOSIS — F329 Major depressive disorder, single episode, unspecified: Secondary | ICD-10-CM

## 2019-12-24 DIAGNOSIS — R5382 Chronic fatigue, unspecified: Secondary | ICD-10-CM | POA: Diagnosis not present

## 2019-12-24 NOTE — Progress Notes (Signed)
PATIENT: Elaine Wells DOB: 04/17/1940  REASON FOR VISIT: follow up HISTORY FROM: patient  Chief Complaint  Patient presents with  . Follow-up    rm 2  . Multiple Sclerosis     HISTORY OF PRESENT ILLNESS: Today 12/24/19 Elaine Wells is a 79 y.o. female here today for follow up for MS, not on DMT. Last MRI stable in 04/2018. She is doing well. No new or worsening symptoms. Depression currently treated with risperidone and fluoxetine, now managed by PCP. She is no longer seeing psychiatry. She continues Adderall XR 10mg  for MS fatigue and ADD. She is also taking Namenda 10mg  BID prescribed by PCP for memory loss. Dr Lyn Hollingshead is following her regularly for depression and memory loss. Her daughter reports she is less active. Not exercising like she used to. She would rather sit and watch TV. Depression seems stable. They are looking into options to increase her activity.   HISTORY: (copied from my note on 05/28/2019)  Elaine Wells is a 79 y.o. female here today for follow up for MS. Not on DMT. She feels that she is doing well overall. She presents today with her son who also feels she is doing well. No changes in vision, gait, bowel or bladder habits. No weakness or numbness. She continues to follow up closely with psychiatry. She does endorse depression but feels she is well managed at this time. She is excited about warmer weather. She enjoys walking at the park. No falls.   HISTORY: (copied from Dr Bonnita Hollow note on 11/27/2018)  Elaine Wells is a 79 year old woman who was diagnosed with MS about 20 years ago.  Update 11/27/2018: She has been mostly stable. She is not on any DMT. LAst MRi was stable (mild worsening atrophy). She has periventricular >subcortical foci. She is walking without a cane though was doing better last year. She is walking many days and could do a half mile before she feels weaker. She can go up and down stairs. No weakness, numbness or dysesthesias. She  has urinary urgency but not incontinence.   She is not outwardly depressed but there is still a lot of irritability. She sees Dr. Lyn Hollingshead for psychiatry. She is on fluoxetine and risperdal. She is forgetful (short term memory). She was placed on memantine. I added Adderall for her focus/attention and she has done better, . She has apathy. She spends a lot of time watching TV. Sometimes she is irritable No recent paraoia  Update 05/03/2018: For the most part, she is stable but cognitive issues are worse. Gait is unchanged. She can climb stairs without much issue. She holds the bannister. No change in strength or sensation. She has urge incontinence helped a lot by oxybutynin. We discussed tradeoff of bladder vs. Cognition and beenfit of oxybutynin is large so they would like to continue. Vision is doing the same. She is having more cognitive issues. She doesn't remember things from earlier in the day. She has some fatigue.   Depression is doing better on Prozac.  Update 08/22/2017: She feels her MS is stable off of a DMT. The MRI of the brain performed 03/11/2017 does not show any new MS lesions performed about 18 months earlier. There is slight progression of the atrophy though this is generalized. Physically, she continues to have a mild gait disturbance but does not need to use a cane. There is no significant numbness or weakness. She has urinary frequency and urgency but no incontinence.  She reports mild  cognitive issues including reduced STM and verbal fluency. Although [name] denies depression, she is more irritable. Additionally, she has a lot more apathy. She no longer reads for fun. She no longer drives. . She had an episode where she exploded at her son and he took her to the ED. Per her daughter, she was a little paranoid at that time. Risperdal 0.5 mg 3 times daily was initially added but that made her too sleepy  and she is now on 0.5 mg once a day with benefit. Namenda was added to the donepezil.   Update 02/23/2017:  She has had MS for over 20 years. She stopped disease modifying therapies around 2015 and the MRIs of the brain's have not shown any new MS activity. There are no significant changes with gait, strength or sensation.   There has been more difficulty with memory over the past few months. She feels she is the same as last year but her daughter is noting more issues with short term memory. She took out money form her bank several times but didn't remember going. She used to read but no longer does so. She occasionally forgets doing some things like putting on seatbelt. Of note, she was complaining of some difficulty with memory at her last visit in 2017. She had been on Aricept but stopped because she did not think there was any benefit. She is back on Aricept but is not sure how long. It does not seem to be helping..  She has had more anxiety but no depression. She sleeps well. No waking up gasping. I reviewed her last 2 MRIs. There was no new MS activity there was very slight increase in atrophy on her last MRI compared to the previous one.  We had a long discussion about her cognitive problems are MS that she could have a second process such as Alzheimer's disease.   From 08/24/2016: She has been off medications x 4 years. At her last visit, she was noting a lot more cognitive issues but she feels she is doing better again. Bladder and sleep issues are bothering her more.  Gait/strength/sensation: She notes that her gait is mildly off but not worsening. She sometimes uses a cane. Gait is better since left tkr. No recent falls. No weakness or numbness. [name] notes right >left hip pain.   Vision: Vision is fine. She never had any MS related vision problems. She feels vision improved after she has had cataract surgery in the past  Bladder: She  notes urinary frequency and urgency are worse since stopping Vesicare. She has nocturia only 2 times nightly. She does not have hesitancy and she empties her bladder well. No bowel incontinence.  Fatigue/sleep: She has physical >cognitive fatigue that is worse as the day goes on. This is worse with heat. She falls asleep easily but wakes up a lot (at least twice for bladder). She snores but nobody has ever told her that she gasps or stops breathing at night. She does not have excessive daytime sleepiness.   Cognition/Mood: She feels cognition is better than last visit and she feels STM is better. She never got any benefit with Aricept and stopped. She stopped Vesicare and feels STM may nbe a little better   MS History: About 22-23 years ago, She had numbness in the left arm and also other episodes of numbness in the legs. However, an MRI was not done when she presented at that time and since she was having a lot of stress  it was felt that there could've been psychologic reasons to her numbness. A few years later , she had an MRI and was diagnosed with MS. She was initially placed on Betaseron but has not been on Betaseron or other disease modifying therapy for the past 6 years or so. She was seeing Dr. Leotis Shames until he moved to [place]. She continued to be seen at [facility] and was seen Dr. Renne Crigler. She reports being told that her MS lesions were "dried up" and that she did not need to be on a medication.   REVIEW OF SYSTEMS: Out of a complete 14 system review of symptoms, the patient complains only of the following symptoms, memory loss, depression, and all other reviewed systems are negative.  ALLERGIES: No Known Allergies  HOME MEDICATIONS: Outpatient Medications Prior to Visit  Medication Sig Dispense Refill  . amLODipine (NORVASC) 10 MG tablet Take 10 mg by mouth daily. For HTN    . amphetamine-dextroamphetamine (ADDERALL XR) 10 MG 24 hr capsule Take 1  capsule daily 30 capsule 0  . FLUoxetine (PROZAC) 20 MG capsule Take 20 mg by mouth daily. For depression    . FLUZONE HIGH-DOSE 0.5 ML SUSY TO BE ADMINISTERED BY PHARMACIST FOR IMMUNIZATION  0  . memantine (NAMENDA) 10 MG tablet Take 1 tablet by mouth 2 (two) times daily.    . metoprolol succinate (TOPROL-XL) 25 MG 24 hr tablet     . oxybutynin (DITROPAN) 5 MG tablet Take 1 tablet by mouth 2 (two) times daily.    . risperiDONE (RISPERDAL) 0.5 MG tablet Take by mouth.     No facility-administered medications prior to visit.    PAST MEDICAL HISTORY: Past Medical History:  Diagnosis Date  . Depression   . Diabetes mellitus    TYPE 2  . Hyperlipidemia   . Hypertension   . Memory loss   . MS (multiple sclerosis) (HCC)   . OAB (overactive bladder)   . Osteoarthritis of left knee   . Vision abnormalities     PAST SURGICAL HISTORY: Past Surgical History:  Procedure Laterality Date  . CATARACT EXTRACTION    . COLONOSCOPY W/ POLYPECTOMY  2005  . 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 Diabetes Mother   . Hypertension Mother   . Cancer Mother        COLON  . Breast cancer Mother   . Diabetes Maternal Grandmother   . Diabetes Paternal Grandmother   . Cancer Father        PROSTATE AND LUNG    SOCIAL HISTORY: Social History   Socioeconomic History  . Marital status: Widowed    Spouse name: Not on file  . Number of children: Not on file  . Years of education: Not on file  . Highest education level: Not on file  Occupational History  . Not on file  Tobacco Use  . Smoking status: Former Smoker    Years: 20.00    Types: Cigarettes    Quit date: 02/28/1988    Years since quitting: 31.8  . Smokeless tobacco: Never Used  Substance and Sexual Activity  . Alcohol use: No    Alcohol/week: 0.0 standard drinks    Comment: rare  . Drug use: No  . Sexual activity: Never    Comment: 1st intercourse- 17,  partners- 2  Other Topics Concern  . Not on file  Social History Narrative  . Not on file  Social Determinants of Health   Financial Resource Strain:   . Difficulty of Paying Living Expenses: Not on file  Food Insecurity:   . Worried About Programme researcher, broadcasting/film/video in the Last Year: Not on file  . Ran Out of Food in the Last Year: Not on file  Transportation Needs:   . Lack of Transportation (Medical): Not on file  . Lack of Transportation (Non-Medical): Not on file  Physical Activity:   . Days of Exercise per Week: Not on file  . Minutes of Exercise per Session: Not on file  Stress:   . Feeling of Stress : Not on file  Social Connections:   . Frequency of Communication with Friends and Family: Not on file  . Frequency of Social Gatherings with Friends and Family: Not on file  . Attends Religious Services: Not on file  . Active Member of Clubs or Organizations: Not on file  . Attends Banker Meetings: Not on file  . Marital Status: Not on file  Intimate Partner Violence:   . Fear of Current or Ex-Partner: Not on file  . Emotionally Abused: Not on file  . Physically Abused: Not on file  . Sexually Abused: Not on file      PHYSICAL EXAM  Vitals:   12/24/19 1437  BP: (!) 144/68  Pulse: 67  Weight: 168 lb (76.2 kg)  Height:  (1.626 m)   Body mass index is 28.84 kg/m.  Generalized: Well developed, in no acute distress  Cardiology: normal rate and rhythm, no murmur noted Respiratory: clear to auscultation bilaterally  Neurological examination  Mentation: Alert, not oriented to time, she is oriented to place and some history taking. Follows all commands speech and language fluent Cranial nerve II-XII: Pupils were equal round reactive to light. Extraocular movements were full, visual field were full on confrontational test. Facial sensation and strength were normal. Head turning and shoulder shrug  were normal and symmetric. Motor: The motor testing reveals  5 over 5 strength of all 4 extremities. Good symmetric motor tone is noted throughout.  Sensory: Sensory testing is intact to soft touch on all 4 extremities. No evidence of extinction is noted.  Coordination: Cerebellar testing reveals good finger-nose-finger and heel-to-shin bilaterally.  Gait and station: Gait is short, mildly stooped posture    DIAGNOSTIC DATA (LABS, IMAGING, TESTING) - I reviewed patient records, labs, notes, testing and imaging myself where available.  No flowsheet data found.   No results found for: WBC, HGB, HCT, MCV, PLT    Component Value Date/Time   NA 142 02/23/2017 1610   K 4.3 02/23/2017 1610   CL 102 02/23/2017 1610   CO2 27 02/23/2017 1610   GLUCOSE 130 (H) 02/23/2017 1610   BUN 17 02/23/2017 1610   CREATININE 0.94 02/23/2017 1610   CALCIUM 10.1 02/23/2017 1610   PROT 7.0 02/23/2017 1610   ALBUMIN 4.4 02/23/2017 1610   AST 28 02/23/2017 1610   ALT 16 02/23/2017 1610   ALKPHOS 98 02/23/2017 1610   BILITOT 0.3 02/23/2017 1610   GFRNONAA 59 (L) 02/23/2017 1610   GFRAA 68 02/23/2017 1610   No results found for: CHOL, HDL, LDLCALC, LDLDIRECT, TRIG, CHOLHDL No results found for: WUJW1X Lab Results  Component Value Date   VITAMINB12 260 02/23/2017   Lab Results  Component Value Date   TSH 2.050 02/23/2017       ASSESSMENT AND PLAN 79 y.o. year old female  has a past medical history of Depression,  Diabetes mellitus, Hyperlipidemia, Hypertension, Memory loss, MS (multiple sclerosis) (HCC), OAB (overactive bladder), Osteoarthritis of left knee, and Vision abnormalities. here with     ICD-10-CM   1. Relapsing remitting multiple sclerosis (HCC)  G35   2. Chronic fatigue  R53.82   3. Major depression, chronic  F32.9   4. Cognitive impairment  R41.89      Alonna is doing fairly well, today. She has noted decreased activity since last being seen. We will continue Adderall XR 10mg  daily. PDMP reviewed and shows appropriate refills. Last refilled  9/27. She will continue close follow up with PCP. Healthy lifestyle habits encouraged. We have discussed importance of regular physical and mental activity. Memory compensation strategies reviewed. She will return to see 10/27 in 6 months, sooner if needed.    No orders of the defined types were placed in this encounter.    No orders of the defined types were placed in this encounter.     I spent 25 minutes with the patient. 50% of this time was spent counseling and educating patient on plan of care and medications.    [place], FNP-C 12/24/2019, 2:58 PM [facility] [address], [address] [place], [place] [phone]

## 2019-12-24 NOTE — Patient Instructions (Signed)
We will continue Adderall XR 10mg  daily.   Please stay well hydrated. Well balanced diet and regular exercise encouraged.   Continue close follow up with psychiatry and PCP.   Follow up with in 6 months    Memory Compensation Strategies  1. Use "WARM" strategy.  W= write it down  A= associate it  R= repeat it  M= make a mental note  2.   You can keep a [place].  Use a 3-ring notebook with sections for the following: calendar, important names and phone numbers,  medications, doctors' names/phone numbers, lists/reminders, and a section to journal what you did  each day.   3.    Use a calendar to write appointments down.  4.    Write yourself a schedule for the day.  This can be placed on the calendar or in a separate section of the Memory Notebook.  Keeping a  regular schedule can help memory.  5.    Use medication organizer with sections for each day or morning/evening pills.  You may need help loading it  6.    Keep a basket, or pegboard by the door.  Place items that you need to take out with you in the basket or on the pegboard.  You may also want to  include a message board for reminders.  7.    Use sticky notes.  Place sticky notes with reminders in a place where the task is performed.  For example: " turn off the  stove" placed by the stove, "lock the door" placed on the door at eye level, " take your medications" on  the bathroom mirror or by the place where you normally take your medications.  8.    Use alarms/timers.  Use while cooking to remind yourself to check on food or as a reminder to take your medicine, or as a  reminder to make a call, or as a reminder to perform another task, etc.   Amphetamine; Dextroamphetamine tablets What is this medicine? AMPHETAMINE; DEXTROAMPHETAMINE(am FET a meen; dex troe am FET a meen) is used to treat attention-deficit hyperactivity disorder (ADHD). It may also be used for narcolepsy. Federal law prohibits giving this  medicine to any person other than the person for whom it was prescribed. Do not share this medicine with anyone else. This medicine may be used for other purposes; ask your health care provider or pharmacist if you have questions. COMMON BRAND NAME(S): Adderall What should I tell my health care provider before I take this medicine? They need to know if you have any of these conditions:  anxiety or panic attacks  circulation problems in fingers and toes  glaucoma  hardening or blockages of the arteries or heart blood vessels  heart disease or a heart defect  high blood pressure  history of a drug or alcohol abuse problem  history of stroke  kidney disease  liver disease  mental illness  seizures  suicidal thoughts, plans, or attempt; a previous suicide attempt by you or a family member  thyroid disease  Tourette's syndrome  an unusual or allergic reaction to dextroamphetamine, other amphetamines, other medicines, foods, dyes, or preservatives  pregnant or trying to get pregnant  breast-feeding How should I use this medicine? Take this medicine by mouth with a glass of water. Follow the directions on the prescription label. Take your doses at regular intervals. Do not take your medicine more often than directed. Do not suddenly stop your medicine. You  must gradually reduce the dose or you may feel withdrawal effects. Ask your doctor or health care professional for advice. Talk to your pediatrician regarding the use of this medicine in children. Special care may be needed. While this drug may be prescribed for children as young as 3 years for selected conditions, precautions do apply. Overdosage: If you think you have taken too much of this medicine contact a poison control center or emergency room at once. NOTE: This medicine is only for you. Do not share this medicine with others. What if I miss a dose? If you miss a dose, take it as soon as you can. If it is almost time  for your next dose, take only that dose. Do not take double or extra doses. What may interact with this medicine? Do not take this medicine with any of the following medications:  MAOIs like Carbex, Eldepryl, Marplan, Nardil, and Parnate  other stimulant medicines for attention disorders This medicine may also interact with the following medications:  acetazolamide  ammonium chloride  antacids  ascorbic acid  atomoxetine  caffeine  certain medicines for blood pressure  certain medicines for depression, anxiety, or psychotic disturbances  certain medicines for seizures like carbamazepine, phenobarbital, phenytoin  certain medicines for stomach problems like cimetidine, ranitidine, famotidine, esomeprazole, omeprazole, lansoprazole, pantoprazole  lithium  medicines for colds and breathing difficulties  medicines for diabetes  medicines or dietary supplements for weight loss or to stay awake  methenamine  narcotic medicines for pain  quinidine  ritonavir  sodium bicarbonate  St. John's wort This list may not describe all possible interactions. Give your health care provider a list of all the medicines, herbs, non-prescription drugs, or dietary supplements you use. Also tell them if you smoke, drink alcohol, or use illegal drugs. Some items may interact with your medicine. What should I watch for while using this medicine? Visit your doctor or health care professional for regular checks on your progress. This prescription requires that you follow special procedures with your doctor and pharmacy. You will need to have a new written prescription from your doctor every time you need a refill. This medicine may affect your concentration, or hide signs of tiredness. Until you know how this medicine affects you, do not drive, ride a bicycle, use machinery, or do anything that needs mental alertness. Tell your doctor or health care professional if this medicine loses its  effects, or if you feel you need to take more than the prescribed amount. Do not change the dosage without talking to your doctor or health care professional. Decreased appetite is a common side effect when starting this medicine. Eating small, frequent meals or snacks can help. Talk to your doctor if you continue to have poor eating habits. Height and weight growth of a child taking this medicine will be monitored closely. Do not take this medicine close to bedtime. It may prevent you from sleeping. If you are going to need surgery, a MRI, CT scan, or other procedure, tell your doctor that you are taking this medicine. You may need to stop taking this medicine before the procedure. Tell your doctor or healthcare professional right away if you notice unexplained wounds on your fingers and toes while taking this medicine. You should also tell your healthcare provider if you experience numbness or pain, changes in the skin color, or sensitivity to temperature in your fingers or toes. What side effects may I notice from receiving this medicine? Side effects that you should report  to your doctor or health care professional as soon as possible:  allergic reactions like skin rash, itching or hives, swelling of the face, lips, or tongue  anxious  breathing problems  changes in emotions or moods  changes in vision  chest pain or chest tightness  fast, irregular heartbeat  fingers or toes feel numb, cool, painful  hallucination, loss of contact with reality  high blood pressure  males: prolonged or painful erection  seizures  signs and symptoms of serotonin syndrome like confusion, increased sweating, fever, tremor, stiff muscles, diarrhea  signs and symptoms of a stroke like changes in vision; confusion; trouble speaking or understanding; severe headaches; sudden numbness or weakness of the face, arm or leg; trouble walking; dizziness; loss of balance or coordination  suicidal thoughts or  other mood changes  uncontrollable head, mouth, neck, arm, or leg movements Side effects that usually do not require medical attention (report to your doctor or health care professional if they continue or are bothersome):  dry mouth  headache  irritability  loss of appetite  nausea  trouble sleeping  weight loss This list may not describe all possible side effects. Call your doctor for medical advice about side effects. You may report side effects to FDA at 1-800-FDA-1088. Where should I keep my medicine? Keep out of the reach of children. This medicine can be abused. Keep your medicine in a safe place to protect it from theft. Do not share this medicine with anyone. Selling or giving away this medicine is dangerous and against the law. Store at room temperature between 15 and 30 degrees C (59 and 86 degrees F). Keep container tightly closed. Throw away any unused medicine after the expiration date. Dispose of properly. This medicine may cause accidental overdose and death if it is taken by other adults, children, or pets. Mix any unused medicine with a substance like cat litter or coffee grounds. Then throw the medicine away in a sealed container like a sealed bag or a coffee can with a lid. Do not use the medicine after the expiration date. NOTE: This sheet is a summary. It may not cover all possible information. If you have questions about this medicine, talk to your doctor, pharmacist, or health care provider.  2020 Elsevier/Gold Standard (2016-05-06 50:93:26)   Multiple Sclerosis Multiple sclerosis (MS) is a disease of the brain, spinal cord, and optic nerves (central nervous system). It causes the body's disease-fighting (immune) system to destroy the protective covering (myelin sheath) around nerves in the brain. When this happens, signals (nerve impulses) going to and from the brain and spinal cord do not get sent properly or may not get sent at all. There are several types of  MS:  Relapsing-remitting MS. This is the most common type. This causes sudden attacks of symptoms. After an attack, you may recover completely until the next attack, or some symptoms may remain permanently.  Secondary progressive MS. This usually develops after the onset of relapsing-remitting MS. Similar to relapsing-remitting MS, this type also causes sudden attacks of symptoms. Attacks may be less frequent, but symptoms slowly get worse (progress) over time.  Primary progressive MS. This causes symptoms that steadily progress over time. This type of MS does not cause sudden attacks of symptoms. The age of onset of MS varies, but it often develops between 94-6 years of age. MS is a lifelong (chronic) condition. There is no cure, but treatment can help slow down the progression of the disease. What are the causes?  The cause of this condition is not known. What increases the risk? You are more likely to develop this condition if:  You are a woman.  You have a relative with MS. However, the condition is not passed from parent to child (inherited).  You have a lack (deficiency) of vitamin D.  You smoke. MS is more common in the [place] than in the [place]. What are the signs or symptoms? Relapsing-remitting and secondary progressive MS cause symptoms to occur in episodes or attacks that may last weeks to months. There may be long periods between attacks in which there are almost no symptoms. Primary progressive MS causes symptoms to steadily progress after they develop. Symptoms of MS vary because of the many different ways it affects the central nervous system. The main symptoms include:  Vision problems and eye pain.  Numbness.  Weakness.  Inability to move your arms, hands, feet, or legs (paralysis).  Balance problems.  Shaking that you cannot control (tremors).  Muscle spasms.  Problems with thinking (cognitive changes). MS can also cause  symptoms that are associated with the disease, but are not always the direct result of an MS attack. They may include:  Inability to control urination or bowel movements (incontinence).  Headaches.  Fatigue.  Inability to tolerate heat.  Emotional changes.  Depression.  Pain. How is this diagnosed? This condition is diagnosed based on:  Your symptoms.  A neurological exam. This involves checking central nervous system function, such as nerve function, reflexes, and coordination.  MRIs of the brain and spinal cord.  Lab tests, including a lumbar puncture that tests the fluid that surrounds the brain and spinal cord (cerebrospinal fluid).  Tests to measure the electrical activity of the brain in response to stimulation (evoked potentials). How is this treated? There is no cure for MS, but medicines can help decrease the number and frequency of attacks and help relieve nuisance symptoms. Treatment options may include:  Medicines that reduce the frequency of attacks. These medicines may be given by injection, by mouth (orally), or through an IV.  Medicines that reduce inflammation (steroids). These may provide short-term relief of symptoms.  Medicines to help control pain, depression, fatigue, or incontinence.  Vitamin D, if you have a deficiency.  Using devices to help you move around (assistive devices), such as braces, a cane, or a walker.  Physical therapy to strengthen and stretch your muscles.  Occupational therapy to help you with everyday tasks.  Alternative or complementary treatments such as exercise, massage, or acupuncture. Follow these instructions at home:  Take over-the-counter and prescription medicines only as told by your health care provider.  Do not drive or use heavy machinery while taking prescription pain medicine.  Use assistive devices as recommended by your physical therapist or your health care provider.  Exercise as directed by your health  care provider.  Return to your normal activities as told by your health care provider. Ask your health care provider what activities are safe for you.  Reach out for support. Share your feelings with friends, family, or a support group.  Keep all follow-up visits as told by your health care provider and therapists. This is important. Where to find more information  National Multiple Sclerosis Society: https://www.nationalmssociety.org Contact a health care provider if:  You feel depressed.  You develop new pain or numbness.  You have tremors.  You have problems with sexual function. Get help right away if:  You develop paralysis.  You  develop numbness.  You have problems with your bladder or bowel function.  You develop double vision.  You lose vision in one or both eyes.  You develop suicidal thoughts.  You develop severe confusion. If you ever feel like you may hurt yourself or others, or have thoughts about taking your own life, get help right away. You can go to your nearest emergency department or call:  Your local emergency services (911 in the [place]  A suicide crisis helpline, such as the National Suicide Prevention Lifeline at (925) 236-9813. This is open 24 hours a day. Summary  Multiple sclerosis (MS) is a disease of the central nervous system that causes the body's immune system to destroy the protective covering (myelin sheath) around nerves in the brain.  There are 3 types of MS: relapsing-remitting, secondary progressive, and primary progressive. Relapsing-remitting and secondary progressive MS cause symptoms to occur in episodes or attacks that may last weeks to months. Primary progressive MS causes symptoms to steadily progress after they develop.  There is no cure for MS, but medicines can help decrease the number and frequency of attacks and help relieve nuisance symptoms. Treatment may also include physical or occupational therapy.  If you develop  numbness, paralysis, vision problems, or other neurological symptoms, get help right away. This information is not intended to replace advice given to you by your health care provider. Make sure you discuss any questions you have with your health care provider. Document Revised: 02/24/2017 Document Reviewed: 05/23/2016 Elsevier Patient Education  2020 [employer].

## 2019-12-25 NOTE — Progress Notes (Signed)
I have read the note, and I agree with the clinical assessment and plan.  Dayle Sherpa A. Elias Dennington, MD, PhD, FAAN Certified in Neurology, Clinical Neurophysiology, Sleep Medicine, Pain Medicine and Neuroimaging  [facility] [address], [address] [place], [place] [address] [phone]  

## 2020-01-20 ENCOUNTER — Other Ambulatory Visit: Payer: Self-pay | Admitting: Family Medicine

## 2020-01-20 MED ORDER — AMPHETAMINE-DEXTROAMPHET ER 10 MG PO CP24: ORAL_CAPSULE | ORAL | 0 refills | Status: DC

## 2020-01-20 NOTE — Telephone Encounter (Signed)
Pt's daughter, Orpah Melter request refill amphetamine-dextroamphetamine (ADDERALL XR) 10 MG 24 hr capsule at [facility].

## 2020-02-17 ENCOUNTER — Other Ambulatory Visit: Payer: Self-pay | Admitting: Family Medicine

## 2020-02-17 MED ORDER — AMPHETAMINE-DEXTROAMPHET ER 10 MG PO CP24: ORAL_CAPSULE | ORAL | 0 refills | Status: DC

## 2020-02-17 NOTE — Telephone Encounter (Signed)
Pt request refill amphetamine-dextroamphetamine (ADDERALL XR) 10 MG 24 hr capsule at [facility]

## 2020-03-17 ENCOUNTER — Telehealth: Payer: Self-pay | Admitting: Family Medicine

## 2020-03-17 MED ORDER — AMPHETAMINE-DEXTROAMPHET ER 10 MG PO CP24: ORAL_CAPSULE | ORAL | 0 refills | Status: DC

## 2020-03-17 NOTE — Telephone Encounter (Signed)
The Adderall prescription was refilled. 

## 2020-03-17 NOTE — Telephone Encounter (Signed)
Pt request refill amphetamine-dextroamphetamine (ADDERALL XR) 10 MG 24 hr capsule at [facility] [address] 

## 2020-04-20 ENCOUNTER — Other Ambulatory Visit: Payer: Self-pay | Admitting: Family Medicine

## 2020-04-20 MED ORDER — AMPHETAMINE-DEXTROAMPHET ER 10 MG PO CP24: ORAL_CAPSULE | ORAL | 0 refills | Status: DC

## 2020-04-20 NOTE — Telephone Encounter (Signed)
Pt.'s daughter Lafonda Mosses is on [place]. She called in a request for amphetamine-dextroamphetamine (ADDERALL XR) 10 MG 24 hr capsule for her mom.  Pharmacy: [facility]

## 2020-04-20 NOTE — Addendum Note (Signed)
Addended by: Guy Begin on: 04/20/2020 02:45 PM   Modules accepted: Orders

## 2020-05-19 ENCOUNTER — Other Ambulatory Visit: Payer: Self-pay | Admitting: Family Medicine

## 2020-05-19 MED ORDER — AMPHETAMINE-DEXTROAMPHET ER 10 MG PO CP24: ORAL_CAPSULE | ORAL | 0 refills | Status: DC

## 2020-05-19 NOTE — Addendum Note (Signed)
Addended by: Guy Begin on: 05/19/2020 10:32 AM   Modules accepted: Orders

## 2020-05-19 NOTE — Telephone Encounter (Signed)
Pt is in need of a refill on  amphetamine-dextroamphetamine (ADDERALL XR) 10 MG 24 hr capsule To [facility] [address]

## 2020-06-19 ENCOUNTER — Telehealth: Payer: Self-pay | Admitting: Family Medicine

## 2020-06-19 MED ORDER — AMPHETAMINE-DEXTROAMPHET ER 10 MG PO CP24: ORAL_CAPSULE | ORAL | 0 refills | Status: DC

## 2020-06-19 NOTE — Telephone Encounter (Signed)
Davis,Dianna(daughter on DPR) asking for a refill on pt's amphetamine-dextroamphetamine (ADDERALL XR) 10 MG 24 hr capsule [facility] [address] Daughter advised this medication may not be able to get called in before Monday, she has asked this request be sent to the on call Dr since pt is completely.

## 2020-06-19 NOTE — Addendum Note (Signed)
Addended by: Asa Lente on: 06/19/2020 10:56 AM   Modules accepted: Orders

## 2020-06-24 ENCOUNTER — Ambulatory Visit: Payer: Medicare Other | Admitting: Neurology

## 2020-06-24 ENCOUNTER — Encounter: Payer: Self-pay | Admitting: Neurology

## 2020-06-24 VITALS — BP 148/80 | HR 61 | Ht 64.0 in | Wt 166.5 lb

## 2020-06-24 DIAGNOSIS — R260 Ataxic gait: Secondary | ICD-10-CM

## 2020-06-24 DIAGNOSIS — F32A Depression, unspecified: Secondary | ICD-10-CM | POA: Diagnosis not present

## 2020-06-24 DIAGNOSIS — R4189 Other symptoms and signs involving cognitive functions and awareness: Secondary | ICD-10-CM

## 2020-06-24 DIAGNOSIS — R350 Frequency of micturition: Secondary | ICD-10-CM

## 2020-06-24 DIAGNOSIS — G35 Multiple sclerosis: Secondary | ICD-10-CM | POA: Diagnosis not present

## 2020-06-24 NOTE — Progress Notes (Signed)
[facility]  PATIENT: Elaine Wells DOB: May 16, 1940  REFERRING CLINICIAN: Cyndy Freeze  HISTORY FROM: Patient  REASON FOR VISIT: MS   HISTORICAL  CHIEF COMPLAINT:  Chief Complaint  Patient presents with  . Follow-up    RM 88 w/ daughter. Last seen 12/24/2019 by AL,NP. Off DMT for MS. No falls. Denies any new sx, stable.    HISTORY OF PRESENT ILLNESS:  Elaine Wells is a 80 year old woman who was diagnosed with MS about 20 years ago.   Update 06/24/2020: She has been stable.    She is not on any DMT since 2015 and MRIs have been stable.       She feels gait, strength and sensatron are doing well.   She is walking without a cane but her daughter notes she is not walking as long and seems to get fatigued easily   She can go up and down stairs. She denies numbness or dysesthesias.  She has urinary urgency but not incontinence.      Vision is doing well.  Mood is ok.   She is less depressive than last year.   She is on fluoxetine and risperdal.  She is forgetful with reduced short term memory but often recalls with hints.   She is on donepezil and memantine.    Adderall has helped focus/attention and she is more alert and mood is better.    Additionally, on Adderall there is less apathy.   She is less irritabl  She has difficulty with urinary urgency and occasional urge incontinence.  Oxybutynin helps.  They are aware that it could worsen cognition some  MS History:    About 22-23 years ago,  She had numbness in the left arm and also other episodes of numbness in the legs. However, an MRI was not done when she presented at that time and since she was having a lot of stress it was felt that there could've been psychologic reasons to her numbness.  A few years later , she had an MRI and was diagnosed with MS. She was initially placed on Betaseron but has not been on Betaseron or other disease modifying therapy for the past 6 years or so. She was seeing Dr. Leotis Shames until he  moved to [place].    She continued to be seen at [facility] and was seen Dr. Renne Crigler.   She reports being told that her MS lesions were "dried up" and that she did not need to be on a medication.  The MRI of the brain performed 03/11/2017 does not show any new MS lesions performed about 18 months earlier.  There is slight progression of the atrophy though this is generalized. MRI of the brain 05/12/2018 showed no new lesions.     REVIEW OF SYSTEMS:  Constitutional: No fevers, chills, sweats, or change in appetite.  She has fatigued and poor sleep Eyes: No visual changes, double vision, eye pain Ear, nose and throat: No hearing loss, ear pain, nasal congestion, sore throat Cardiovascular: No chest pain, palpitations Respiratory:  No shortness of breath at rest or with exertion.   No wheezes.   She snores GastrointestinaI: No nausea, vomiting, diarrhea, abdominal pain, fecal incontinence Genitourinary:  see above.    Musculoskeletal:  Notes R hip pain Integumentary: No rash, pruritus, skin lesions Neurological: as above Psychiatric: Mild depression and anxiety Endocrine: No palpitations, diaphoresis, change in appetite, change in weigh or increased thirst Hematologic/Lymphatic:  No anemia, purpura, petechiae. Allergic/Immunologic: No itchy/runny eyes, nasal  congestion, recent allergic reactions, rashes  ALLERGIES: No Known Allergies  HOME MEDICATIONS: Outpatient Medications Prior to Visit  Medication Sig Dispense Refill  . amLODipine (NORVASC) 10 MG tablet Take 10 mg by mouth daily. For HTN    . amphetamine-dextroamphetamine (ADDERALL XR) 10 MG 24 hr capsule Take 1 capsule daily 30 capsule 0  . donepezil (ARICEPT) 10 MG tablet Take 10 mg by mouth at bedtime.    Marland Kitchen FLUoxetine (PROZAC) 20 MG capsule Take 20 mg by mouth daily. For depression    . FLUZONE HIGH-DOSE 0.5 ML SUSY TO BE ADMINISTERED BY PHARMACIST FOR IMMUNIZATION  0  . memantine (NAMENDA) 10 MG tablet Take 1 tablet by mouth 2  (two) times daily.    . metoprolol succinate (TOPROL-XL) 25 MG 24 hr tablet     . oxybutynin (DITROPAN) 5 MG tablet Take 1 tablet by mouth 2 (two) times daily.    . risperiDONE (RISPERDAL) 0.5 MG tablet Take by mouth.     No facility-administered medications prior to visit.    PAST MEDICAL HISTORY: Past Medical History:  Diagnosis Date  . Depression   . Diabetes mellitus    TYPE 2  . Hyperlipidemia   . Hypertension   . Memory loss   . MS (multiple sclerosis) (HCC)   . OAB (overactive bladder)   . Osteoarthritis of left knee   . Vision abnormalities     PAST SURGICAL HISTORY: Past Surgical History:  Procedure Laterality Date  . CATARACT EXTRACTION    . COLONOSCOPY W/ POLYPECTOMY  2005  . 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 Diabetes Mother   . Hypertension Mother   . Cancer Mother        COLON  . Breast cancer Mother   . Diabetes Maternal Grandmother   . Diabetes Paternal Grandmother   . Cancer Father        PROSTATE AND LUNG    SOCIAL HISTORY:  Social History   Socioeconomic History  . Marital status: Widowed    Spouse name: Not on file  . Number of children: Not on file  . Years of education: Not on file  . Highest education level: Not on file  Occupational History  . Not on file  Tobacco Use  . Smoking status: Former Smoker    Years: 20.00    Types: Cigarettes    Quit date: 02/28/1988    Years since quitting: 32.3  . Smokeless tobacco: Never Used  Substance and Sexual Activity  . Alcohol use: No    Alcohol/week: 0.0 standard drinks    Comment: rare  . Drug use: No  . Sexual activity: Never    Comment: 1st intercourse- 17, partners- 2  Other Topics Concern  . Not on file  Social History Narrative  . Not on file   Social Determinants of Health   Financial Resource Strain: Not on file  Food Insecurity: Not on file  Transportation Needs: Not on file  Physical Activity:  Not on file  Stress: Not on file  Social Connections: Not on file  Intimate Partner Violence: Not on file     PHYSICAL EXAM  Vitals:   06/24/20 1439  BP: (!) 148/80  Pulse: 61  SpO2: 98%  Weight: 166 lb 8 oz (75.5 kg)  Height: 5\' 4"  (1.626 m)      General: The patient is well-developed and well-nourished and in no acute distress.  She has a positive right Faber sign.   Mildly tender over right trochanteric bursa.     Neurologic Exam  Mental status: The patient is alert and oriented x 2 1/2 (month but wrong date and year) .  Focus and attention is reduced (WORLD-???).  Short-term memory is reduced (2/3).  Speech is normal.  Cranial nerves: Extraocular movements are full.  Facial strength is normal.  Trapezius strength is normal.  No obvious hearing deficits are noted.  Motor:  Muscle bulk is normal and tone is slightly increased in legs.  Strength is 5/5 in the arms and legs..   Sensory: She has intact sensation to touch and vibration in all 4 extremities  Coordination: Cerebellar testing reveals good finger-nose-finger and mildly reduced heel-to-shin bilaterally.  Gait and station: Station is normal with eyes open and closed.  The gait is mildly wide.  Tandem gait is wide.  Romberg is negative.  Her turns are unbalanced.  Reflexes: Deep tendon reflexes are symmetric and brisk bilaterally.      DIAGNOSTIC DATA (LABS, IMAGING, TESTING) - I reviewed patient records, labs, notes, testing and imaging myself where available.      ASSESSMENT AND PLAN   MULTIPLE SCLEROSIS - Plan: MR BRAIN WO CONTRAST  Cognitive impairment - Plan: MR BRAIN WO CONTRAST  Depression, unspecified depression type  Urinary frequency  Ataxic gait   1.    She appears to be stable off of a disease modifying therapy.  No new lesions were seen on the MRI in 2020.  We need to check this 1 more time to make sure that she remained stable.  I will order an MRI of the brain to compare to her  previous one.  2.    Unclear if difficulty with memory is due entirely to the MS or to combination of MS and a neurodegenerative process such as Alzheimer's disease.  She is on donepezil and Namenda.   3.   Continue Adderall XR 10 mg daily.  She will also continue medications prescribed by psychiatry. 4.  We had discussed that oxybutynin might worsen memory.  However, due to the severity of her urinary in continence and its impact she would like to remain on the medication..  5.   She will return to see me in 6 months or sooner if there are new or worsening neurologic symptoms.   Kynsleigh Westendorf A. Epimenio Foot, MD, PhD 06/24/2020, 3:17 PM Certified in Neurology, Clinical Neurophysiology, Sleep Medicine, Pain Medicine and Neuroimaging  [facility] [address], [address] [place], [place] [address] [phone]

## 2020-06-25 ENCOUNTER — Telehealth: Payer: Self-pay | Admitting: Neurology

## 2020-06-25 NOTE — Telephone Encounter (Signed)
[employer] medicare no auth. Sent a message to [place] at [place] she will reach out to the patient to schedule.

## 2020-07-04 ENCOUNTER — Ambulatory Visit (HOSPITAL_BASED_OUTPATIENT_CLINIC_OR_DEPARTMENT_OTHER)
Admission: RE | Admit: 2020-07-04 | Discharge: 2020-07-04 | Disposition: A | Discharge status: Home / Self Care | Payer: Medicare Other | Source: Ambulatory Visit | Attending: Neurology | Admitting: Neurology

## 2020-07-04 ENCOUNTER — Other Ambulatory Visit: Payer: Self-pay

## 2020-07-04 DIAGNOSIS — R4189 Other symptoms and signs involving cognitive functions and awareness: Secondary | ICD-10-CM | POA: Diagnosis present

## 2020-07-04 DIAGNOSIS — G35 Multiple sclerosis: Secondary | ICD-10-CM | POA: Diagnosis present

## 2020-07-04 MED ORDER — GADOBUTROL 1 MMOL/ML IV SOLN
7.5000 mL | Freq: Once | INTRAVENOUS | Status: AC | PRN
  Administered 2020-07-04: 7.5 mL via INTRAVENOUS

## 2020-07-06 ENCOUNTER — Telehealth: Payer: Self-pay | Admitting: *Deleted

## 2020-07-06 NOTE — Telephone Encounter (Signed)
Called and spoke with daughter (on [place]) about MRI results per Dr. Bonnita Hollow note. She verbalized understanding. She requested copy be sent to PCP. I sent copy as requested.

## 2020-07-06 NOTE — Telephone Encounter (Signed)
-----   Message from Asa Lente, MD sent at 07/06/2020  9:44 AM EDT ----- Please let her know that: the MRI of the brain showed that the MS was stable.  There were no new MS lesions.  She did have a very tiny speck of blood on the right that was not there on the previous MRI.  It probably would not have caused any symptoms.  Sometimes that happens when people have hypertension.  She needs to try to keep the blood pressure under the best control possible.

## 2020-07-20 ENCOUNTER — Other Ambulatory Visit: Payer: Self-pay | Admitting: Neurology

## 2020-07-20 NOTE — Telephone Encounter (Signed)
Pt is needing a refill on her amphetamine-dextroamphetamine (ADDERALL XR) 10 MG 24 hr capsule sent in to the [facility] on [address].

## 2020-07-20 NOTE — Telephone Encounter (Signed)
Pt last seen 06/24/20. Per [place] registry, last refilled on 06/19/2020 Dextroamp-Amphet Er 10 Mg Cap #30/30 prescribed by Dr Epimenio Foot. MD out of office today, will send refill request to Dr Terrace Arabia (work-in MD).

## 2020-07-21 MED ORDER — AMPHETAMINE-DEXTROAMPHET ER 10 MG PO CP24: ORAL_CAPSULE | ORAL | 0 refills | Status: DC

## 2020-07-21 NOTE — Telephone Encounter (Signed)
Received a voicemail from patient's daughter, Lafonda Mosses, asking for an update on patient's Adderall refill.  Patient has been out Adderall since Sunday.  Patient's daughter is asking to be called at [phone].

## 2020-07-21 NOTE — Telephone Encounter (Signed)
Called and spoke with daughter at [phone] (tried other # provided but would not work x2). Spoke with daughter who states it was her work #. I relayed refill sent in. She verbalized understanding and appreciation.

## 2020-08-19 ENCOUNTER — Other Ambulatory Visit: Payer: Self-pay | Admitting: Neurology

## 2020-08-19 MED ORDER — AMPHETAMINE-DEXTROAMPHET ER 10 MG PO CP24: ORAL_CAPSULE | ORAL | 0 refills | Status: DC

## 2020-08-19 NOTE — Telephone Encounter (Signed)
Pt is requesting a refill for amphetamine-dextroamphetamine (ADDERALL XR) 10 MG 24 hr capsule.  Pharmacy:  [id number]  

## 2020-08-19 NOTE — Addendum Note (Signed)
Addended by: Arther Abbott on: 08/19/2020 10:01 AM   Modules accepted: Orders

## 2020-09-17 ENCOUNTER — Other Ambulatory Visit: Payer: Self-pay | Admitting: Neurology

## 2020-09-17 MED ORDER — AMPHETAMINE-DEXTROAMPHET ER 10 MG PO CP24: ORAL_CAPSULE | ORAL | 0 refills | Status: DC

## 2020-09-17 NOTE — Telephone Encounter (Signed)
Pt is needing a refill on her amphetamine-dextroamphetamine (ADDERALL XR) 10 MG 24 hr capsule sent in to the [facility] on [address].

## 2020-10-19 ENCOUNTER — Telehealth: Payer: Self-pay | Admitting: Neurology

## 2020-10-19 ENCOUNTER — Other Ambulatory Visit: Payer: Self-pay | Admitting: Neurology

## 2020-10-19 MED ORDER — AMPHETAMINE-DEXTROAMPHET ER 10 MG PO CP24: ORAL_CAPSULE | ORAL | 0 refills | Status: DC

## 2020-10-19 NOTE — Telephone Encounter (Signed)
Pt's daughter Orpah Melter on [place] called and LVM requesting a refill on the pt's amphetamine-dextroamphetamine (ADDERALL XR) 10 MG 24 hr capsule

## 2020-10-19 NOTE — Telephone Encounter (Signed)
I have routed this request to Dr Sater for review. The pt is due for the medication and [place] registry was verified.  

## 2020-11-19 ENCOUNTER — Other Ambulatory Visit: Payer: Self-pay | Admitting: Neurology

## 2020-11-19 MED ORDER — AMPHETAMINE-DEXTROAMPHET ER 10 MG PO CP24: ORAL_CAPSULE | ORAL | 0 refills | Status: DC

## 2020-11-19 NOTE — Telephone Encounter (Signed)
Pt's daughter has called for a refill on amphetamine-dextroamphetamine (ADDERALL XR) 10 MG 24 hr capsule to  [facility] [address]

## 2020-12-17 ENCOUNTER — Other Ambulatory Visit: Payer: Self-pay | Admitting: Neurology

## 2020-12-17 MED ORDER — AMPHETAMINE-DEXTROAMPHET ER 10 MG PO CP24: ORAL_CAPSULE | ORAL | 0 refills | Status: DC

## 2020-12-17 NOTE — Telephone Encounter (Signed)
Pt is requesting a refill for amphetamine-dextroamphetamine (ADDERALL XR) 10 MG 24 hr capsule.  Pharmacy:  [id number]  

## 2020-12-30 ENCOUNTER — Ambulatory Visit: Payer: Medicare Other | Admitting: Family Medicine

## 2020-12-30 ENCOUNTER — Encounter: Payer: Self-pay | Admitting: Family Medicine

## 2020-12-30 ENCOUNTER — Telehealth: Payer: Self-pay | Admitting: Family Medicine

## 2020-12-30 VITALS — BP 163/69 | HR 81 | Ht 64.0 in | Wt 168.0 lb

## 2020-12-30 DIAGNOSIS — Z9181 History of falling: Secondary | ICD-10-CM

## 2020-12-30 DIAGNOSIS — F32A Depression, unspecified: Secondary | ICD-10-CM

## 2020-12-30 DIAGNOSIS — R350 Frequency of micturition: Secondary | ICD-10-CM | POA: Diagnosis not present

## 2020-12-30 DIAGNOSIS — R5382 Chronic fatigue, unspecified: Secondary | ICD-10-CM

## 2020-12-30 DIAGNOSIS — R4189 Other symptoms and signs involving cognitive functions and awareness: Secondary | ICD-10-CM | POA: Diagnosis not present

## 2020-12-30 DIAGNOSIS — G35 Multiple sclerosis: Secondary | ICD-10-CM

## 2020-12-30 DIAGNOSIS — R29898 Other symptoms and signs involving the musculoskeletal system: Secondary | ICD-10-CM

## 2020-12-30 DIAGNOSIS — E559 Vitamin D deficiency, unspecified: Secondary | ICD-10-CM

## 2020-12-30 NOTE — Telephone Encounter (Signed)
PT/OT referral(s) sent to [facility] in [facility] (patient requests location in [facility] where she resides). They will call patient to schedule. Phone: [phone].

## 2020-12-30 NOTE — Patient Instructions (Addendum)
Below is our plan:  We will continue current treatment plan. I will order PT/OT. I will check vitamin D level.   Please make sure you are staying well hydrated. I recommend 50-60 ounces daily. Well balanced diet and regular exercise encouraged. Consistent sleep schedule with 6-8 hours recommended.   Please continue follow up with care team as directed.   Follow up with Dr Epimenio Foot in 6 months   You may receive a survey regarding today's visit. I encourage you to leave honest feed back as I do use this information to improve patient care. Thank you for seeing me today!

## 2020-12-30 NOTE — Progress Notes (Addendum)
PATIENT: Elaine Wells DOB: 05/22/1940  REASON FOR VISIT: follow up HISTORY FROM: patient  Chief Complaint  Patient presents with   Follow-up    New w daughter Elaine Wells, alone. Here for 6 month MS f/u, off DMT for MS. Pts daughter reports, "she moving a lot less and wanting to sleep a lot more".       HISTORY OF PRESENT ILLNESS: 12/30/20 ALL: Elaine Wells returns for follow up for RRMS. Off DMT since 2015. Last MRI 06/2020 was stable.  [name] reports feeling okay. She denies any significant changes or new symptoms. She presents with her daughter who reports she is not as active as she used to be. She gets winded when walking linger distances. No pain. No falls but has seemed more off balance, recently. She seems to be limping some but denies pain.   She continues Adderall XR 10mg  daily for attention deficit. Her daughter reports that this medication works and she can definitely tell a big difference in her energy and mood if she does not take this. Memory stable on Aricept and Namenda (managed by PCP). She feels mood is good. She continues fluoxetine and risperidone. Her daughter reports that she wants to sleep all the time. She is less motivated than normal.   She has a history of vit D def. She is not on supplementation.   06/24/2020 RS: Elaine Wells is a 80 year old woman who was diagnosed with MS about 20 years ago.    She has been stable.    She is not on any DMT since 2015 and MRIs have been stable.        She feels gait, strength and sensatron are doing well.   She is walking without a cane but her daughter notes she is not walking as long and seems to get fatigued easily   She can go up and down stairs. She denies numbness or dysesthesias.  She has urinary urgency but not incontinence.      Vision is doing well.   Mood is ok.   She is less depressive than last year.   She is on fluoxetine and risperdal.  She is forgetful with reduced short term memory but often recalls with  hints.   She is on donepezil and memantine.    Adderall has helped focus/attention and she is more alert and mood is better.    Additionally, on Adderall there is less apathy.   She is less irritabl   She has difficulty with urinary urgency and occasional urge incontinence.  Oxybutynin helps.  They are aware that it could worsen cognition some   MS History:    About 22-23 years ago,  She had numbness in the left arm and also other episodes of numbness in the legs. However, an MRI was not done when she presented at that time and since she was having a lot of stress it was felt that there could've been psychologic reasons to her numbness.  A few years later , she had an MRI and was diagnosed with MS. She was initially placed on Betaseron but has not been on Betaseron or other disease modifying therapy for the past 6 years or so. She was seeing Dr. 2016 until he moved to [place].    She continued to be seen at [facility] and was seen [facility].   She reports being told that her MS lesions were "dried up" and that she did not need to be on a  medication.   The MRI of the brain performed 03/11/2017 does not show any new MS lesions performed about 18 months earlier.  There is slight progression of the atrophy though this is generalized. MRI of the brain 05/12/2018 showed no new lesions.  12/24/2019 ALL: Elaine Wells is a 80 y.o. female here today for follow up for MS, not on DMT. Last MRI stable in 04/2018. She is doing well. No new or worsening symptoms. Depression currently treated with risperidone and fluoxetine, now managed by PCP. She is no longer seeing psychiatry. She continues Adderall XR 10mg  for MS fatigue and ADD. She is also taking Namenda 10mg  BID prescribed by PCP for memory loss. Dr is following her regularly for depression and memory loss. Her daughter reports she is less active. Not exercising like she used to. She would rather sit and watch TV. Depression seems stable. They are  looking into options to increase her activity.   REVIEW OF SYSTEMS: Out of a complete 14 system review of symptoms, the patient complains only of the following symptoms, memory loss, depression, and all other reviewed systems are negative.  ALLERGIES: No Known Allergies  HOME MEDICATIONS: Outpatient Medications Prior to Visit  Medication Sig Dispense Refill   amLODipine (NORVASC) 10 MG tablet Take 10 mg by mouth daily. For HTN     amphetamine-dextroamphetamine (ADDERALL XR) 10 MG 24 hr capsule Take 1 capsule daily 30 capsule 0   donepezil (ARICEPT) 10 MG tablet Take 10 mg by mouth at bedtime.     FLUoxetine (PROZAC) 20 MG capsule Take 20 mg by mouth daily. For depression     FLUZONE HIGH-DOSE 0.5 ML SUSY TO BE ADMINISTERED BY PHARMACIST FOR IMMUNIZATION  0   memantine (NAMENDA) 10 MG tablet Take 1 tablet by mouth 2 (two) times daily.     metoprolol succinate (TOPROL-XL) 25 MG 24 hr tablet      oxybutynin (DITROPAN) 5 MG tablet Take 1 tablet by mouth 2 (two) times daily.     risperiDONE (RISPERDAL) 0.5 MG tablet Take by mouth.     No facility-administered medications prior to visit.    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    FAMILY HISTORY: Family History  Problem Relation Age of Onset   Diabetes Mother    Hypertension Mother    Cancer Mother        COLON   Breast cancer Mother    Diabetes Maternal Grandmother    Diabetes Paternal Grandmother    Cancer Father        PROSTATE AND LUNG    SOCIAL HISTORY: Social History   Socioeconomic History   Marital  status: Widowed    Spouse name: Not on file   Number of children: Not on file   Years of education: Not on file   Highest education level: Not on file  Occupational History   Not on file  Tobacco Use   Smoking status: Former    Years: 20.00  Types: Cigarettes    Quit date: 02/28/1988    Years since quitting: 32.8   Smokeless tobacco: Never  Substance and Sexual Activity   Alcohol use: No    Alcohol/week: 0.0 standard drinks    Comment: rare   Drug use: No   Sexual activity: Never    Comment: 1st intercourse- 17, partners- 2  Other Topics Concern   Not on file  Social History Narrative   Not on file   Social Determinants of Health   Financial Resource Strain: Not on file  Food Insecurity: Not on file  Transportation Needs: Not on file  Physical Activity: Not on file  Stress: Not on file  Social Connections: Not on file  Intimate Partner Violence: Not on file      PHYSICAL EXAM  Vitals:   12/30/20 1448  BP: (!) 163/69  Pulse: 81  Weight: 168 lb (76.2 kg)  Height: 5\' 4"  (1.626 m)    Body mass index is 28.84 kg/m.  Generalized: Well developed, in no acute distress, flat affect  Cardiology: normal rate and rhythm, no murmur noted Respiratory: clear to auscultation bilaterally  Neurological examination  Mentation: Alert, not oriented to time, she is oriented to place and some history taking. Follows all commands speech and language fluent Cranial nerve II-XII: Pupils were equal round reactive to light. Extraocular movements were full, visual field were full on confrontational test. Facial sensation and strength were normal. Head turning and shoulder shrug  were normal and symmetric. Motor: The motor testing reveals 5 over 5 strength of all 4 extremities with exception of 4+/5 of bilateral lower extremities. Good symmetric motor tone is noted throughout.  Sensory: Sensory testing is intact to soft touch on all 4 extremities. No evidence of extinction is noted.   Coordination: Cerebellar testing reveals good finger-nose-finger and heel-to-shin bilaterally.  Gait and station: Gait is short, left limp, mildly stooped posture    DIAGNOSTIC DATA (LABS, IMAGING, TESTING) - I reviewed patient records, labs, notes, testing and imaging myself where available.  No flowsheet data found.   No results found for: WBC, HGB, HCT, MCV, PLT    Component Value Date/Time   NA 142 02/23/2017 1610   K 4.3 02/23/2017 1610   CL 102 02/23/2017 1610   CO2 27 02/23/2017 1610   GLUCOSE 130 (H) 02/23/2017 1610   BUN 17 02/23/2017 1610   CREATININE 0.94 02/23/2017 1610   CALCIUM 10.1 02/23/2017 1610   PROT 7.0 02/23/2017 1610   ALBUMIN 4.4 02/23/2017 1610   AST 28 02/23/2017 1610   ALT 16 02/23/2017 1610   ALKPHOS 98 02/23/2017 1610   BILITOT 0.3 02/23/2017 1610   GFRNONAA 59 (L) 02/23/2017 1610   GFRAA 68 02/23/2017 1610   No results found for: CHOL, HDL, LDLCALC, LDLDIRECT, TRIG, CHOLHDL No results found for: 02/25/2017 Lab Results  Component Value Date   VITAMINB12 260 02/23/2017   Lab Results  Component Value Date   TSH 2.050 02/23/2017       ASSESSMENT AND PLAN 80 y.o. year old female  has a past medical history of Depression, Diabetes mellitus, Hyperlipidemia, Hypertension, Memory loss, MS (multiple sclerosis) (HCC), OAB (overactive bladder), Osteoarthritis of left knee, and Vision abnormalities. here with     ICD-10-CM   1. Relapsing remitting multiple sclerosis (HCC)  G35 Ambulatory referral to Physical Therapy    Ambulatory referral to Occupational Therapy    2. Chronic fatigue  R53.82     3. Urinary frequency  R35.0  4. Cognitive impairment  R41.89     5. Depression, unspecified depression type  F32.A     6. Weakness of both lower extremities  R29.898 Ambulatory referral to Physical Therapy    Ambulatory referral to Occupational Therapy    7. Risk for falls  Z91.81 Ambulatory referral to Physical Therapy    Ambulatory referral to  Occupational Therapy    8. Vitamin D deficiency  E55.9 Vitamin D, 25-hydroxy        Alixis is doing fairly well, today. She has noted decreased activity since last being seen. We will continue Adderall XR 10mg  daily. PDMP reviewed and shows appropriate refills. Last refilled 9/22. Gait is short and she has more of a limp today. She has 4+/5 strengths of bilateral lower extremities and no sensory changes. She denies pain. I do not suspect MS exacerbation as this appears to have been a gradual change over months. I would like to have her work with PT/OT for strengthening due to risk of falls. She has steps in her home that she is having to utilize to get to the restroom. I am hopeful this may help increase activity as well. We will check vitamin D level. She will continue close follow up with PCP. Healthy lifestyle habits encouraged. We have discussed importance of regular physical and mental activity. Memory compensation strategies reviewed. She will return to see 10/22 in 6 months, sooner if needed.    Orders Placed This Encounter  Procedures   Vitamin D, 25-hydroxy   Ambulatory referral to Physical Therapy    Referral Priority:   Routine    Referral Type:   Physical Medicine    Referral Reason:   Specialty Services Required    Requested Specialty:   Physical Therapy    Number of Visits Requested:   1   Ambulatory referral to Occupational Therapy    Referral Priority:   Routine    Referral Type:   Occupational Therapy    Referral Reason:   Specialty Services Required    Requested Specialty:   Occupational Therapy    Number of Visits Requested:   1      No orders of the defined types were placed in this encounter.     [place], FNP-C 12/30/2020, 4:28 PM [facility] [address], [address] [place], [place] [phone]

## 2020-12-31 ENCOUNTER — Other Ambulatory Visit: Payer: Self-pay | Admitting: Family Medicine

## 2020-12-31 ENCOUNTER — Telehealth: Payer: Self-pay | Admitting: *Deleted

## 2020-12-31 LAB — VITAMIN D 25 HYDROXY (VIT D DEFICIENCY, FRACTURES): Vit D, 25-Hydroxy: 11.6 ng/mL — ABNORMAL LOW (ref 30.0–100.0)

## 2020-12-31 MED ORDER — VITAMIN D (ERGOCALCIFEROL) 1.25 MG (50000 UNIT) PO CAPS: 50000.0000 [IU] | ORAL_CAPSULE | ORAL | 0 refills | Status: AC

## 2020-12-31 NOTE — Telephone Encounter (Signed)
Called and LVM for daughter relaying results. Asked her to call back if there are any further questions.

## 2020-12-31 NOTE — Telephone Encounter (Signed)
-----   Message from Shawnie Dapper, NP sent at 12/31/2020  9:52 AM EDT ----- Hey ladies. Please let her daughter know that her vitamin D was pretty low. I am going to call in vitamin d rx for 50,000iu weekly for 13 weeks. After she finishes that I would like for her to take 5,000iu daily. She can get this over the counter. We will recheck at her follow up.

## 2021-01-18 ENCOUNTER — Other Ambulatory Visit: Payer: Self-pay | Admitting: Family Medicine

## 2021-01-18 MED ORDER — AMPHETAMINE-DEXTROAMPHET ER 10 MG PO CP24: ORAL_CAPSULE | ORAL | 0 refills | Status: DC

## 2021-01-18 NOTE — Telephone Encounter (Signed)
Pt's daughter, Orpah Melter request refill for amphetamine-dextroamphetamine (ADDERALL XR) 10 MG 24 hr capsule at [facility] [id number]

## 2021-02-16 ENCOUNTER — Other Ambulatory Visit: Payer: Self-pay | Admitting: Family Medicine

## 2021-02-16 MED ORDER — AMPHETAMINE-DEXTROAMPHET ER 10 MG PO CP24: ORAL_CAPSULE | ORAL | 0 refills | Status: DC

## 2021-02-16 NOTE — Telephone Encounter (Signed)
Pt is needing a refill request for her amphetamine-dextroamphetamine (ADDERALL XR) 10 MG 24 hr capsule sent the the [facility] on [address].

## 2021-02-16 NOTE — Telephone Encounter (Signed)
Received refill request for Adderall XR 10mg .  Last OV was on 12/30/20.  Next OV is scheduled for 07/01/21 .  Last RX was written on 01/18/21 for 30 tabs.   [place] Drug Database has been reviewed.

## 2021-03-17 ENCOUNTER — Other Ambulatory Visit: Payer: Self-pay | Admitting: Neurology

## 2021-03-17 MED ORDER — AMPHETAMINE-DEXTROAMPHET ER 10 MG PO CP24: ORAL_CAPSULE | ORAL | 0 refills | Status: DC

## 2021-03-17 NOTE — Telephone Encounter (Signed)
Pt's daughter called requesting refill for amphetamine-dextroamphetamine (ADDERALL XR) 10 MG 24 hr capsule. Pharmacy [facility] [id number] - [place], [place] - [address]

## 2021-04-07 ENCOUNTER — Telehealth: Payer: Self-pay | Admitting: Neurology

## 2021-04-07 DIAGNOSIS — G35 Multiple sclerosis: Secondary | ICD-10-CM

## 2021-04-07 DIAGNOSIS — R4189 Other symptoms and signs involving cognitive functions and awareness: Secondary | ICD-10-CM

## 2021-04-07 NOTE — Telephone Encounter (Signed)
Called the patient's daughter back. She states that they are ready to pursue completing therapy. She spoke with the  [facility] and they state a new referral was needed. Order placed and we will get the referral sent over

## 2021-04-07 NOTE — Telephone Encounter (Signed)
Patient daughter Elaine Wells left a voicemail on my phone stating she would like to get PT for her mother.

## 2021-04-19 ENCOUNTER — Other Ambulatory Visit: Payer: Self-pay | Admitting: Family Medicine

## 2021-04-19 MED ORDER — AMPHETAMINE-DEXTROAMPHET ER 10 MG PO CP24: ORAL_CAPSULE | ORAL | 0 refills | Status: DC

## 2021-04-19 NOTE — Telephone Encounter (Signed)
Pt is requesting a refill for amphetamine-dextroamphetamine (ADDERALL XR) 10 MG 24 hr capsule.  Pharmacy: [facility] [place] [facility] [id number] Daughter confirmed no changes to pt's insurance with the [employer]

## 2021-05-18 ENCOUNTER — Other Ambulatory Visit: Payer: Self-pay | Admitting: Family Medicine

## 2021-05-18 NOTE — Telephone Encounter (Signed)
Amy- please e-scribe. Checked drug registry. Last refilled 04/19/21 #30. Last seen 12/30/20 and next f/u 07/01/21.

## 2021-05-18 NOTE — Telephone Encounter (Signed)
Pt's daughter, Elaine Wells request refill for amphetamine-dextroamphetamine (ADDERALL XR) 10 MG 24 hr capsule at [facility] [id number] 

## 2021-05-19 MED ORDER — AMPHETAMINE-DEXTROAMPHET ER 10 MG PO CP24: ORAL_CAPSULE | ORAL | 0 refills | Status: DC

## 2021-06-21 ENCOUNTER — Other Ambulatory Visit: Payer: Self-pay | Admitting: Family Medicine

## 2021-06-21 MED ORDER — AMPHETAMINE-DEXTROAMPHET ER 10 MG PO CP24: ORAL_CAPSULE | ORAL | 0 refills | Status: DC

## 2021-06-21 NOTE — Telephone Encounter (Signed)
Pt's daughter, Dianna Davis request refill for amphetamine-dextroamphetamine (ADDERALL XR) 10 MG 24 hr capsule at [facility] [id number] 

## 2021-07-01 ENCOUNTER — Ambulatory Visit: Payer: Medicare Other | Admitting: Neurology

## 2021-07-01 ENCOUNTER — Encounter: Payer: Self-pay | Admitting: Neurology

## 2021-07-01 VITALS — BP 186/77 | HR 65 | Ht 64.0 in | Wt 162.0 lb

## 2021-07-01 DIAGNOSIS — R4189 Other symptoms and signs involving cognitive functions and awareness: Secondary | ICD-10-CM

## 2021-07-01 DIAGNOSIS — R260 Ataxic gait: Secondary | ICD-10-CM | POA: Diagnosis not present

## 2021-07-01 DIAGNOSIS — G35 Multiple sclerosis: Secondary | ICD-10-CM | POA: Diagnosis not present

## 2021-07-01 DIAGNOSIS — R350 Frequency of micturition: Secondary | ICD-10-CM

## 2021-07-01 NOTE — Progress Notes (Signed)
? ?[facility] ? ?PATIENT: Elaine Wells ?DOB: May 25, 1940 ? ?REFERRING CLINICIAN: Cyndy Freeze  ?HISTORY FROM: Patient  ? ?REASON FOR VISIT: MS ? ? ?HISTORICAL ? ?CHIEF COMPLAINT:  ?Chief Complaint  ?Patient presents with  ? Follow-up  ?  Rm 2, w daughter Lafonda Mosses. Here for 6 month MS f/u, off DMT. MS stable.   ? ? ?HISTORY OF PRESENT ILLNESS:  ?Elaine Wells is a 81 year old woman who was diagnosed with MS about 20 years ago.  ? ?Update 07/01/2021 ?She has been stable.    She is not on any DMT since 2015 and MRIs have been stable.      ? ?She has no exacerbation but gait is mildly worse.  She can go 100 feet without a break but often uses support of others.  She says it is outside of her house without family.  Strength and sensatron are doing well.   She is walking without a cane but her daughter notes she is not walking as long and seems to get fatigued easily   She can go up and down stairs. She denies numbness or dysesthesias.  She has urinary urgency but not incontinence.      Vision is doing well. ? ?Mood is ok.   She is less depressive than last year.   She is on fluoxetine and risperdal.  She is forgetful with reduced short term memory but often recalls with hints.   She is on donepezil and memantine.    Adderall has helped focus/attention and she is more alert and mood is better.    Additionally, on Adderall there is less apathy.   She is less irritabl ? ?She has difficulty with urinary urgency and occasional urge incontinence.  Oxybutynin helps.  Other medications have not been covered.  They are aware that it could worsen cognition some ? ?MS History:    ?About 22-23 years ago,  She had numbness in the left arm and also other episodes of numbness in the legs. However, an MRI was not done when she presented at that time and since she was having a lot of stress it was felt that there could've been psychologic reasons to her numbness.  A few years later , she had an MRI and was diagnosed with MS.  She was initially placed on Betaseron but has not been on Betaseron or other disease modifying therapy for the past 6 years or so. She was seeing Dr. Leotis Shames until he moved to [place].    She continued to be seen at [facility] and was seen Dr. Renne Crigler.   She reports being told that her MS lesions were "dried up" and that she did not need to be on a medication. ? ?Imaging: ?The MRI of the brain performed 03/11/2017 does not show any new MS lesions performed about 18 months earlier.  There is slight progression of the atrophy though this is generalized.  ? ?MRI of the brain 05/13/2018 showed no new lesions. ? ?MRI of the brain 07/04/2020 showed no new lesions due to the MS compared to the MRI from 2020.  There was a chronic microhemorrhage in the right thalamus. ? ? ?REVIEW OF SYSTEMS:  ?Constitutional: No fevers, chills, sweats, or change in appetite.  She has fatigued and poor sleep ?Eyes: No visual changes, double vision, eye pain ?Ear, nose and throat: No hearing loss, ear pain, nasal congestion, sore throat ?Cardiovascular: No chest pain, palpitations ?Respiratory:  No shortness of breath at rest or with exertion.  No wheezes.   She snores ?GastrointestinaI: No nausea, vomiting, diarrhea, abdominal pain, fecal incontinence ?Genitourinary:  see above.    ?Musculoskeletal:  Notes R hip pain ?Integumentary: No rash, pruritus, skin lesions ?Neurological: as above ?Psychiatric: Mild depression and anxiety ?Endocrine: No palpitations, diaphoresis, change in appetite, change in weigh or increased thirst ?Hematologic/Lymphatic:  No anemia, purpura, petechiae. ?Allergic/Immunologic: No itchy/runny eyes, nasal congestion, recent allergic reactions, rashes ? ?ALLERGIES: ?No Known Allergies ? ?HOME MEDICATIONS: ?Outpatient Medications Prior to Visit  ?Medication Sig Dispense Refill  ? amLODipine (NORVASC) 10 MG tablet Take 10 mg by mouth daily. For HTN    ? amphetamine-dextroamphetamine (ADDERALL XR) 10 MG 24 hr capsule Take 1  capsule daily 30 capsule 0  ? donepezil (ARICEPT) 10 MG tablet Take 10 mg by mouth at bedtime.    ? FLUoxetine (PROZAC) 20 MG capsule Take 20 mg by mouth daily. For depression    ? FLUZONE HIGH-DOSE 0.5 ML SUSY TO BE ADMINISTERED BY PHARMACIST FOR IMMUNIZATION  0  ? memantine (NAMENDA) 10 MG tablet Take 1 tablet by mouth 2 (two) times daily.    ? metoprolol succinate (TOPROL-XL) 25 MG 24 hr tablet     ? oxybutynin (DITROPAN) 5 MG tablet Take 1 tablet by mouth 2 (two) times daily.    ? risperiDONE (RISPERDAL) 0.5 MG tablet Take by mouth.    ? Vitamin D, Ergocalciferol, (DRISDOL) 1.25 MG (50000 UNIT) CAPS capsule Take 1 capsule (50,000 Units total) by mouth every 7 (seven) days. 13 capsule 0  ? ?No facility-administered medications prior to visit.  ? ? ?PAST MEDICAL HISTORY: ?Past Medical History:  ?Diagnosis Date  ? Depression   ? Diabetes mellitus   ? TYPE 2  ? Hyperlipidemia   ? Hypertension   ? Memory loss   ? MS (multiple sclerosis) (HCC)   ? OAB (overactive bladder)   ? Osteoarthritis of left knee   ? Vision abnormalities   ? ? ?PAST SURGICAL HISTORY: ?Past Surgical History:  ?Procedure Laterality Date  ? CATARACT EXTRACTION    ? COLONOSCOPY W/ POLYPECTOMY  2005  ? 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 ? ?FAMILY HISTORY: ?Family History  ?Problem Relation Age of Onset  ? Diabetes Mother   ? Hypertension Mother   ? Cancer Mother   ?     COLON  ? Breast cancer Mother   ? Diabetes Maternal Grandmother   ? Diabetes Paternal Grandmother   ? Cancer Father   ?     PROSTATE AND LUNG  ? ? ?SOCIAL HISTORY: ? ?Social History  ? ?Socioeconomic History  ? Marital status: Widowed  ?  Spouse name: Not on file  ? Number of children: Not on file  ? Years of education: Not on file  ? Highest education level: Not on file  ?Occupational History  ? Not on file  ?Tobacco Use  ? Smoking status: Former  ?  Years: 20.00  ?  Types: Cigarettes  ?  Quit date: 02/28/1988  ?  Years since quitting: 33.3  ? Smokeless tobacco: Never   ?Substance and Sexual Activity  ? Alcohol use: No  ?  Alcohol/week: 0.0 standard drinks  ?  Comment: rare  ? Drug use: No  ? Sexual activity: Never  ?  Comment: 1st intercourse- 17, partners- 2  ?Other Topics Concern  ? Not on file  ?Social History Narrative  ? Not on file  ? ?Social Determinants of Health  ? ?Financial Resource Strain: Not on file  ?  Food Insecurity: Not on file  ?Transportation Needs: Not on file  ?Physical Activity: Not on file  ?Stress: Not on file  ?Social Connections: Not on file  ?Intimate Partner Violence: Not on file  ? ? ? ?PHYSICAL EXAM ? ?Vitals:  ? 07/01/21 1443  ?BP: (!) 186/77  ?Pulse: 65  ?Weight: 162 lb (73.5 kg)  ?Height: 5\' 4"  (1.626 m)  ? ? ? ? ?General: The patient is well-developed and well-nourished and in no acute distress.   She has a positive right Faber sign.   Mildly tender over right trochanteric bursa.    ? ?Neurologic Exam ? ?Mental status: The patient is alert and oriented x 2  (not date) .  Focus and attention is reduced (WORLD-???).  Short-term memory is reduced (0/3 without hints and 2/3 with hints).  Speech is normal. ? ?Cranial nerves: Extraocular movements are full.  Facial strength is normal.  Trapezius strength is normal.  No obvious hearing deficits are noted. ? ?Motor:  Muscle bulk is normal and tone is slightly increased in legs.  Strength is 5/5 in the arms and legs..  ? ?Sensory: She has intact sensation to touch and vibration in all 4 extremities ? ?Coordination: Cerebellar testing reveals good finger-nose-finger and mildly reduced heel-to-shin bilaterally. ? ?Gait and station: Station is normal with eyes open and closed.  Her gait is wide but she has a reasonably good stride.  Tandem gait is wide.  Romberg is borderline.  Her turns are unbalanced. ? ?Reflexes: Deep tendon reflexes are symmetric and brisk bilaterally. ?  ? ? ? ? ? ?ASSESSMENT AND PLAN ? ? ?Multiple sclerosis (HCC) - Plan: Ambulatory referral to Physical Therapy ? ?Ataxic gait - Plan:  Ambulatory referral to Physical Therapy ? ?Urinary frequency ? ?Cognitive impairment ? ? ?1.   Her MS continues to be stable off of a disease modifying therapy.  No new lesions were seen on the MRI in 2022.  2.

## 2021-07-06 ENCOUNTER — Telehealth: Payer: Self-pay | Admitting: Neurology

## 2021-07-06 NOTE — Telephone Encounter (Signed)
Referral sent to [facility] [phone]. ?

## 2021-07-20 ENCOUNTER — Other Ambulatory Visit: Payer: Self-pay | Admitting: Neurology

## 2021-07-20 MED ORDER — AMPHETAMINE-DEXTROAMPHET ER 10 MG PO CP24: ORAL_CAPSULE | ORAL | 0 refills | Status: DC

## 2021-07-20 NOTE — Telephone Encounter (Signed)
Pt's daughter, Dianna Davis request refill for amphetamine-dextroamphetamine (ADDERALL XR) 10 MG 24 hr capsule at [facility] [id number] 

## 2021-08-18 ENCOUNTER — Other Ambulatory Visit: Payer: Self-pay | Admitting: Neurology

## 2021-08-18 MED ORDER — AMPHETAMINE-DEXTROAMPHET ER 10 MG PO CP24
ORAL_CAPSULE | ORAL | 0 refills | Status: DC
Start: 2021-08-18 — End: 2021-08-19

## 2021-08-18 NOTE — Telephone Encounter (Signed)
Pt is needing a refill request for her amphetamine-dextroamphetamine (ADDERALL XR) 10 MG 24 hr capsule sent to the [facility] [address]

## 2021-08-19 ENCOUNTER — Telehealth: Payer: Self-pay | Admitting: Neurology

## 2021-08-19 MED ORDER — AMPHETAMINE-DEXTROAMPHET ER 10 MG PO CP24: ORAL_CAPSULE | ORAL | 0 refills | Status: DC

## 2021-08-19 NOTE — Telephone Encounter (Signed)
Pt's daughter, Orpah Melter said [facility] did not have refill for amphetamine-dextroamphetamine (ADDERALL XR) 10 MG 24 hr capsule in stock. Asking for refill to be resent to: [facility]  [address]  [place], [place], [address] Phone: [phone] Fax: [phone]

## 2021-08-24 ENCOUNTER — Other Ambulatory Visit: Payer: Self-pay | Admitting: Neurology

## 2021-08-24 NOTE — Telephone Encounter (Signed)
Patient's daughter, Adele Schilder, left a voicemail with our office this afternoon asking for the patient's Adderall prescription to be sent to [facility].  They have the generic in stock. Patient's daughter is asking for a call.

## 2021-08-24 NOTE — Addendum Note (Signed)
Addended by: Darleen Crocker on: 08/24/2021 03:47 PM   Modules accepted: Orders

## 2021-08-25 ENCOUNTER — Other Ambulatory Visit (HOSPITAL_BASED_OUTPATIENT_CLINIC_OR_DEPARTMENT_OTHER): Payer: Self-pay

## 2021-08-25 MED ORDER — AMPHETAMINE-DEXTROAMPHET ER 10 MG PO CP24
10.0000 mg | ORAL_CAPSULE | Freq: Every day | ORAL | 0 refills | Status: DC
  Filled 2021-08-25: qty 30, 30d supply, fill #0

## 2021-08-25 NOTE — Addendum Note (Signed)
Addended by: Despina Arias A on: 08/25/2021 06:00 PM   Modules accepted: Orders

## 2021-08-26 ENCOUNTER — Other Ambulatory Visit (HOSPITAL_BASED_OUTPATIENT_CLINIC_OR_DEPARTMENT_OTHER): Payer: Self-pay

## 2021-08-26 ENCOUNTER — Other Ambulatory Visit: Payer: Self-pay | Admitting: Neurology

## 2021-08-26 ENCOUNTER — Telehealth: Payer: Self-pay | Admitting: Neurology

## 2021-08-26 MED ORDER — AMPHETAMINE-DEXTROAMPHET ER 10 MG PO CP24: 10.0000 mg | ORAL_CAPSULE | Freq: Every day | ORAL | 0 refills | Status: DC

## 2021-08-26 NOTE — Telephone Encounter (Signed)
Pt's daughter, Rosalio Macadamia refill for amphetamine-dextroamphetamine (ADDERALL XR) 10 MG 24 hr capsule was sent to the wrong pharmacy ([facility])  Need prescription resent to [facility] [address] [place], [place] [address] Phone: [phone] Fax:[phone]

## 2021-08-26 NOTE — Telephone Encounter (Signed)
This was in correct. The refill needs to go to [facility] and I have confirmed that with the daughter. She states they do have it in stock. I have resent to Dr Epimenio Foot for him to send for the pt

## 2021-08-26 NOTE — Telephone Encounter (Signed)
Pt's daughter, Orpah Melter request refill for amphetamine-dextroamphetamine (ADDERALL XR) 10 MG 24 hr capsule at [facility]

## 2021-08-26 NOTE — Telephone Encounter (Signed)
Daughter states that it was sent to wrong pharmacy. Pt is needing the refill sent to this pharmacy where her daughter works near. I have called the daughter to confirm this in fact is correct pharmacy because it was just sent to [facility] as mentioned in other phone note and she states that was the wrong pharmacy. It should go to this pharmacy in [facility]. She has confirmed they have it in stock. I have verified the pharmacy is correct and will send to Dr Epimenio Foot to resend

## 2021-09-23 ENCOUNTER — Other Ambulatory Visit: Payer: Self-pay | Admitting: Neurology

## 2021-09-23 MED ORDER — AMPHETAMINE-DEXTROAMPHET ER 10 MG PO CP24: 10.0000 mg | ORAL_CAPSULE | Freq: Every day | ORAL | 0 refills | Status: DC

## 2021-09-23 NOTE — Addendum Note (Signed)
Addended by: Judi Cong on: 09/23/2021 02:47 PM   Modules accepted: Orders

## 2021-09-23 NOTE — Telephone Encounter (Signed)
Pt is requesting a refill for amphetamine-dextroamphetamine (ADDERALL XR) 10 MG 24 hr capsule.  Pharmacy:  [facility] [place] [facility] [id number]

## 2021-09-23 NOTE — Telephone Encounter (Signed)
Pt's daughter has called to report that [phone] does not have the medication.  Pt's daughter is asking that the amphetamine-dextroamphetamine (ADDERALL XR) 10 MG 24 hr capsule, be called into [facility] [place] [facility]

## 2021-10-19 ENCOUNTER — Other Ambulatory Visit: Payer: Self-pay | Admitting: Neurology

## 2021-10-19 MED ORDER — AMPHETAMINE-DEXTROAMPHET ER 10 MG PO CP24: 10.0000 mg | ORAL_CAPSULE | Freq: Every day | ORAL | 0 refills | Status: DC

## 2021-10-19 NOTE — Telephone Encounter (Signed)
Pt is requesting a refill for amphetamine-dextroamphetamine (ADDERALL XR) 10 MG 24 hr capsule.  Pharmacy: [facility] [place] [facility]

## 2021-11-23 ENCOUNTER — Other Ambulatory Visit: Payer: Self-pay | Admitting: Neurology

## 2021-11-23 MED ORDER — AMPHETAMINE-DEXTROAMPHET ER 10 MG PO CP24: 10.0000 mg | ORAL_CAPSULE | Freq: Every day | ORAL | 0 refills | Status: DC

## 2021-11-23 NOTE — Telephone Encounter (Signed)
Pt is requesting a refill for amphetamine-dextroamphetamine (ADDERALL XR) 10 MG 24 hr capsule .  Pharmacy: [facility] [place] [facility]

## 2021-11-23 NOTE — Telephone Encounter (Signed)
Last seen 07/01/21 and next f/u 01/05/22. Per drug registry, last refilled 10/26/21 #30.

## 2021-12-23 ENCOUNTER — Other Ambulatory Visit: Payer: Self-pay | Admitting: Neurology

## 2021-12-23 MED ORDER — AMPHETAMINE-DEXTROAMPHET ER 10 MG PO CP24: 10.0000 mg | ORAL_CAPSULE | Freq: Every day | ORAL | 0 refills | Status: DC

## 2021-12-23 NOTE — Telephone Encounter (Signed)
Pt's daughter, Rosalio Macadamia request refill for amphetamine-dextroamphetamine (ADDERALL XR) 10 MG 24 hr capsule at [facility]

## 2022-01-03 NOTE — Progress Notes (Unsigned)
PATIENT: Elaine Wells DOB: 14-May-1940  REASON FOR VISIT: follow up HISTORY FROM: patient  No chief complaint on file.  HISTORY OF PRESENT ILLNESS:  01/03/22 ALL: Elaine Wells returns for follow up for RRMS. Off DMT since 2015. Last MRI 06/2020 was stable.  Elaine Wells reports feeling okay. She denies any significant changes or new symptoms. She presents with her daughter who reports she is not as active as she used to be. She gets winded when walking linger distances. No pain. No falls but has seemed more off balance, recently. She seems to be limping some but denies pain.   She continues Adderall XR 10mg  daily for attention deficit. Her daughter reports that this medication works and she can definitely tell a big difference in her energy and mood if she does not take this. Memory stable on Aricept and Namenda (managed by PCP). She feels mood is good. She continues fluoxetine and risperidone. Her daughter reports that she wants to sleep all the time. She is less motivated than normal.   She has a history of vit D def. She is not on supplementation.   07/01/2021 RS: She has been stable.    She is not on any DMT since 2015 and MRIs have been stable.        She has no exacerbation but gait is mildly worse.  She can go 100 feet without a break but often uses support of others.  She says it is outside of her house without family.  Strength and sensatron are doing well.   She is walking without a cane but her daughter notes she is not walking as long and seems to get fatigued easily   She can go up and down stairs. She denies numbness or dysesthesias.  She has urinary urgency but not incontinence.      Vision is doing well.   Mood is ok.   She is less depressive than last year.   She is on fluoxetine and risperdal.  She is forgetful with reduced short term memory but often recalls with hints.   She is on donepezil and memantine.    Adderall has helped focus/attention and she is more alert and  mood is better.    Additionally, on Adderall there is less apathy.   She is less irritabl   She has difficulty with urinary urgency and occasional urge incontinence.  Oxybutynin helps.  Other medications have not been covered.  They are aware that it could worsen cognition some   Elaine History:    About 22-23 years ago,  She had numbness in the left arm and also other episodes of numbness in the legs. However, an MRI was not done when she presented at that time and since she was having a lot of stress it was felt that there could've been psychologic reasons to her numbness.  A few years later , she had an MRI and was diagnosed with Elaine. She was initially placed on Betaseron but has not been on Betaseron or other disease modifying therapy for the past 6 years or so. She was seeing Dr. 2016 until he moved to [place].    She continued to be seen at [facility] and was seen Dr. [facility].   She reports being told that her Elaine lesions were "dried up" and that she did not need to be on a medication.   Imaging: The MRI of the brain performed 03/11/2017 does not show any new Elaine lesions performed  about 18 months earlier.  There is slight progression of the atrophy though this is generalized.    MRI of the brain 05/13/2018 showed no new lesions.   MRI of the brain 07/04/2020 showed no new lesions due to the Elaine compared to the MRI from 2020.  There was a chronic microhemorrhage in the right thalamus.    REVIEW OF SYSTEMS: Out of a complete 14 system review of symptoms, the patient complains only of the following symptoms, memory loss, depression, and all other reviewed systems are negative.   ALLERGIES: No Known Allergies  HOME MEDICATIONS: Outpatient Medications Prior to Visit  Medication Sig Dispense Refill   amLODipine (NORVASC) 10 MG tablet Take 10 mg by mouth daily. For HTN     amphetamine-dextroamphetamine (ADDERALL XR) 10 MG 24 hr capsule Take 1 capsule (10 mg total) by mouth daily. 30 capsule 0    donepezil (ARICEPT) 10 MG tablet Take 10 mg by mouth at bedtime.     FLUoxetine (PROZAC) 20 MG capsule Take 20 mg by mouth daily. For depression     FLUZONE HIGH-DOSE 0.5 ML SUSY TO BE ADMINISTERED BY PHARMACIST FOR IMMUNIZATION  0   memantine (NAMENDA) 10 MG tablet Take 1 tablet by mouth 2 (two) times daily.     metoprolol succinate (TOPROL-XL) 25 MG 24 hr tablet      oxybutynin (DITROPAN) 5 MG tablet Take 1 tablet by mouth 2 (two) times daily.     risperiDONE (RISPERDAL) 0.5 MG tablet Take by mouth.     Vitamin D, Ergocalciferol, (DRISDOL) 1.25 MG (50000 UNIT) CAPS capsule Take 1 capsule (50,000 Units total) by mouth every 7 (seven) days. 13 capsule 0   No facility-administered medications prior to visit.    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    FAMILY HISTORY: Family History  Problem Relation Age of Onset   Diabetes Mother    Hypertension Mother    Cancer Mother        COLON   Breast cancer Mother    Diabetes Maternal Grandmother    Diabetes Paternal Grandmother    Cancer Father        PROSTATE AND LUNG    SOCIAL HISTORY: Social History   Socioeconomic History   Marital status: Widowed    Spouse name: Not on file   Number of children: Not on file   Years of education: Not on file   Highest education level: Not on file  Occupational History   Not on file  Tobacco Use   Smoking status: Former    Years: 20.00    Types: Cigarettes    Quit date: 02/28/1988    Years since quitting: 33.8   Smokeless tobacco: Never  Substance and Sexual Activity    Alcohol use: No    Alcohol/week: 0.0 standard drinks of alcohol    Comment: rare   Drug use: No   Sexual activity: Never    Comment: 1st intercourse- 17, partners- 2  Other Topics Concern  Not on file  Social History Narrative   Not on file   Social Determinants of Health   Financial Resource Strain: Not on file  Food Insecurity: Not on file  Transportation Needs: Not on file  Physical Activity: Not on file  Stress: Not on file  Social Connections: Not on file  Intimate Partner Violence: Not on file      PHYSICAL EXAM  There were no vitals filed for this visit.   There is no height or weight on file to calculate BMI.  Generalized: Well developed, in no acute distress, flat affect  Cardiology: normal rate and rhythm, no murmur noted Respiratory: clear to auscultation bilaterally  Neurological examination  Mentation: Alert, not oriented to time, she is oriented to place and some history taking. Follows all commands speech and language fluent Cranial nerve II-XII: Pupils were equal round reactive to light. Extraocular movements were full, visual field were full on confrontational test. Facial sensation and strength were normal. Head turning and shoulder shrug  were normal and symmetric. Motor: The motor testing reveals 5 over 5 strength of all 4 extremities with exception of 4+/5 of bilateral lower extremities. Good symmetric motor tone is noted throughout.  Sensory: Sensory testing is intact to soft touch on all 4 extremities. No evidence of extinction is noted.  Coordination: Cerebellar testing reveals good finger-nose-finger and heel-to-shin bilaterally.  Gait and station: Gait is short, left limp, mildly stooped posture    DIAGNOSTIC DATA (LABS, IMAGING, TESTING) - I reviewed patient records, labs, notes, testing and imaging myself where available.      No data to display           No results found for: "WBC", "HGB", "HCT", "MCV", "PLT"    Component Value  Date/Time   NA 142 02/23/2017 1610   K 4.3 02/23/2017 1610   CL 102 02/23/2017 1610   CO2 27 02/23/2017 1610   GLUCOSE 130 (H) 02/23/2017 1610   BUN 17 02/23/2017 1610   CREATININE 0.94 02/23/2017 1610   CALCIUM 10.1 02/23/2017 1610   PROT 7.0 02/23/2017 1610   ALBUMIN 4.4 02/23/2017 1610   AST 28 02/23/2017 1610   ALT 16 02/23/2017 1610   ALKPHOS 98 02/23/2017 1610   BILITOT 0.3 02/23/2017 1610   GFRNONAA 59 (L) 02/23/2017 1610   GFRAA 68 02/23/2017 1610   No results found for: "CHOL", "HDL", "LDLCALC", "LDLDIRECT", "TRIG", "CHOLHDL" No results found for: "HGBA1C" Lab Results  Component Value Date   VITAMINB12 260 02/23/2017   Lab Results  Component Value Date   TSH 2.050 02/23/2017       ASSESSMENT AND PLAN 81 y.o. year old female  has a past medical history of Depression, Diabetes mellitus, Hyperlipidemia, Hypertension, Memory loss, Elaine (multiple sclerosis) (HCC), OAB (overactive bladder), Osteoarthritis of left knee, and Vision abnormalities. here with     ICD-10-CM   1. Relapsing remitting multiple sclerosis (HCC)  G35          Viveca is doing fairly well, today. She has noted decreased activity since last being seen. We will continue Adderall XR 10mg  daily. PDMP reviewed and shows appropriate refills. Last refilled 9/22. Gait is short and she has more of a limp today. She has 4+/5 strengths of bilateral lower extremities and no sensory changes. She denies pain. I do not suspect Elaine exacerbation as this appears to have been a gradual change over months. I would like to have her work with PT/OT for strengthening due to risk of falls.  She has steps in her home that she is having to utilize to get to the restroom. I am hopeful this may help increase activity as well. We will check vitamin D level. She will continue close follow up with PCP. Healthy lifestyle habits encouraged. We have discussed importance of regular physical and mental activity. Memory compensation strategies  reviewed. She will return to see [place] in 6 months, sooner if needed.    No orders of the defined types were placed in this encounter.     No orders of the defined types were placed in this encounter.      Shawnie Dapper, FNP-C 01/03/2022, 12:22 PM [facility] [address] [place], [place] [address] [phone]

## 2022-01-05 ENCOUNTER — Encounter: Payer: Self-pay | Admitting: Family Medicine

## 2022-01-05 ENCOUNTER — Ambulatory Visit: Payer: Medicare Other | Admitting: Family Medicine

## 2022-01-05 VITALS — BP 160/70 | HR 59 | Ht 64.0 in | Wt 161.5 lb

## 2022-01-05 DIAGNOSIS — G35 Multiple sclerosis: Secondary | ICD-10-CM

## 2022-01-05 DIAGNOSIS — F32A Depression, unspecified: Secondary | ICD-10-CM

## 2022-01-05 DIAGNOSIS — E559 Vitamin D deficiency, unspecified: Secondary | ICD-10-CM | POA: Diagnosis not present

## 2022-01-05 DIAGNOSIS — R260 Ataxic gait: Secondary | ICD-10-CM | POA: Diagnosis not present

## 2022-01-05 DIAGNOSIS — R4189 Other symptoms and signs involving cognitive functions and awareness: Secondary | ICD-10-CM

## 2022-01-05 DIAGNOSIS — R5382 Chronic fatigue, unspecified: Secondary | ICD-10-CM

## 2022-01-05 NOTE — Patient Instructions (Signed)
Below is our plan:  We will continue to monitor. I will update vitamin D level and start supplement pending result.s   Please make sure you are staying well hydrated. I recommend 50-60 ounces daily. Well balanced diet and regular exercise encouraged. Consistent sleep schedule with 6-8 hours recommended.   Please continue follow up with care team as directed.   Follow up with me in 6 months   You may receive a survey regarding today's visit. I encourage you to leave honest feed back as I do use this information to improve patient care. Thank you for seeing me today!   Management of Memory Problems   There are some general things you can do to help manage your memory problems.  Your memory may not in fact recover, but by using techniques and strategies you will be able to manage your memory difficulties better.   1)  Establish a routine. Try to establish and then stick to a regular routine.  By doing this, you will get used to what to expect and you will reduce the need to rely on your memory.  Also, try to do things at the same time of day, such as taking your medication or checking your calendar first thing in the morning. Think about think that you can do as a part of a regular routine and make a list.  Then enter them into a daily planner to remind you.  This will help you establish a routine.   2)  Organize your environment. Organize your environment so that it is uncluttered.  Decrease visual stimulation.  Place everyday items such as keys or cell phone in the same place every day (ie.  Basket next to front door) Use post it notes with a brief message to yourself (ie. Turn off light, lock the door) Use labels to indicate where things go (ie. Which cupboards are for food, dishes, etc.) Keep a notepad and pen by the telephone to take messages   3)  Memory Aids A diary or journal/notebook/daily planner Making a list (shopping list, chore list, to do list that needs to be done) Using an  alarm as a reminder (kitchen timer or cell phone alarm) Using cell phone to store information (Notes, Calendar, Reminders) Calendar/White board placed in a prominent position Post-it notes   In order for memory aids to be useful, you need to have good habits.  It's no good remembering to make a note in your journal if you don't remember to look in it.  Try setting aside a certain time of day to look in journal.   4)  Improving mood and managing fatigue. There may be other factors that contribute to memory difficulties.  Factors, such as anxiety, depression and tiredness can affect memory. Regular gentle exercise can help improve your mood and give you more energy. Exercise: there are short videos created by the [employer] on Health specially for older adults: https://bit.ly/2I30q97.  Mediterranean diet: which emphasizes fruits, vegetables, whole grains, legumes, fish, and other seafood; unsaturated fats such as olive oils; and low amounts of red meat, eggs, and sweets. A variation of this, called MIND (Holiday Intervention for Neurodegenerative Delay) incorporates the DASH (Dietary Approaches to Stop Hypertension) diet, which has been shown to lower high blood pressure, a risk factor for Alzheimer's disease. More information at: RepublicForum.gl.  Aerobic exercise that improve heart health is also good for the mind.  [employer] on Aging have short videos for exercises that you can do at  home: BlindWorkshop.com.pt Simple relaxation techniques may help relieve symptoms of anxiety Try to get back to completing activities or hobbies you enjoyed doing in the past. Learn to pace yourself through activities to decrease fatigue. Find out about some local support groups where you can share experiences with others. Try and achieve 7-8 hours of sleep at night.

## 2022-01-06 ENCOUNTER — Telehealth: Payer: Self-pay

## 2022-01-06 LAB — VITAMIN D 25 HYDROXY (VIT D DEFICIENCY, FRACTURES): Vit D, 25-Hydroxy: 53.7 ng/mL (ref 30.0–100.0)

## 2022-01-06 NOTE — Telephone Encounter (Signed)
-----   Message from Debbora Presto, NP sent at 01/06/2022  7:50 AM EDT ----- Please let her daughter know that her vitamin D level is actually good! If she is not taking supplements, that is ok. If she is, continue current dose. TY!

## 2022-01-06 NOTE — Telephone Encounter (Signed)
Called pt to review labs. No answer and advise pt to call back if she has any question. 

## 2022-01-24 ENCOUNTER — Other Ambulatory Visit: Payer: Self-pay | Admitting: Family Medicine

## 2022-01-24 MED ORDER — AMPHETAMINE-DEXTROAMPHET ER 10 MG PO CP24: 10.0000 mg | ORAL_CAPSULE | Freq: Every day | ORAL | 0 refills | Status: DC

## 2022-01-24 NOTE — Telephone Encounter (Signed)
Pt daughter is calling. Requesting a refill on medication amphetamine-dextroamphetamine (ADDERALL XR) 10 MG 24 hr capsule. Refill should be sent to [facility]

## 2022-02-23 ENCOUNTER — Other Ambulatory Visit: Payer: Self-pay | Admitting: Family Medicine

## 2022-02-23 MED ORDER — AMPHETAMINE-DEXTROAMPHET ER 10 MG PO CP24: 10.0000 mg | ORAL_CAPSULE | Freq: Every day | ORAL | 0 refills | Status: DC

## 2022-02-23 NOTE — Telephone Encounter (Signed)
Pt is needing a refill request for her amphetamine-dextroamphetamine (ADDERALL XR) 10 MG 24 hr capsule sent in to the [facility]

## 2022-02-23 NOTE — Telephone Encounter (Signed)
Pt last seen 01/05/22 and next f/u 07/18/22. Per drug registry, last refilled 01/25/22 #30.

## 2022-03-24 ENCOUNTER — Other Ambulatory Visit: Payer: Self-pay | Admitting: Family Medicine

## 2022-03-24 MED ORDER — AMPHETAMINE-DEXTROAMPHET ER 10 MG PO CP24: 10.0000 mg | ORAL_CAPSULE | Freq: Every day | ORAL | 0 refills | Status: AC

## 2022-03-24 NOTE — Telephone Encounter (Signed)
I have routed this request to Dr Pearlean Brownie for review. The pt is due for the medication and [place] registry was verified.

## 2022-03-24 NOTE — Telephone Encounter (Signed)
Pt daughter is calling. Requesting a refill on medication amphetamine-dextroamphetamine (ADDERALL XR) 10 MG 24 hr capsule.Refill should be sent to [facility].

## 2022-05-01 NOTE — Progress Notes (Unsigned)
[facility]  PATIENT: Elaine Wells DOB: [birth year]  REFERRING CLINICIAN: Jordan Hawks  HISTORY FROM: Patient   REASON FOR VISIT: MS   HISTORICAL  CHIEF COMPLAINT:  No chief complaint on file.   HISTORY OF PRESENT ILLNESS:  Elaine Wells is a 82 year old woman who was diagnosed with MS about 20 years ago.   Update 05/02/2022 She was recently hospitalized 04/13/2022 with altered mental status, E. coli UTI and new strokes.  She was treated with IV antibiotics but continued to have cognitive changes in the hospital.  The MRI from 04/14/2022 showed multiple foci of restricted diffusion predominantly in the right temporal lobe with smaller scattered foci in the right parietal lobe and a very small focus in the periventricular left parietal lobe.  There was avid enhancement of some of these foci.   CT angiogram 04/15/2022 showed moderate right internal carotid artery cavernous segment stenosis and mild bilateral carotid bifurcation atherosclerosis.    Although some of the foci were periventricular, the overall pattern MRI is most consistent with strokes  Before the recent events, she has been stable.    She is not on any DMT since 2015 and MRIs have been stable.        ***She has no exacerbation but gait is mildly worse.  She can go 100 feet without a break but often uses support of others.  She says it is outside of her house without family.  Strength and sensatron are doing well.   She is walking without a cane but her daughter notes she is not walking as long and seems to get fatigued easily   She can go up and down stairs. She denies numbness or dysesthesias.  She has urinary urgency but not incontinence.      Vision is doing well.  Mood is ok.   She is less depressive than last year.   She is on fluoxetine and risperdal.  She is forgetful with reduced short term memory but often recalls with hints.   She is on donepezil and memantine.    Adderall has helped focus/attention  and she is more alert and mood is better.    Additionally, on Adderall there is less apathy.   She is less irritabl  She has difficulty with urinary urgency and occasional urge incontinence.  Oxybutynin helps.  Other medications have not been covered.  They are aware that it could worsen cognition some  MS History:    About 22-23 years ago,  She had numbness in the left arm and also other episodes of numbness in the legs. However, an MRI was not done when she presented at that time and since she was having a lot of stress it was felt that there could've been psychologic reasons to her numbness.  A few years later , she had an MRI and was diagnosed with MS. She was initially placed on Betaseron but has not been on Betaseron or other disease modifying therapy for the past 6 years or so. She was seeing Dr. Jacqulynn Cadet until he moved to [place].    She continued to be seen at [facility] and was seen Dr. Shelia Media.   She reports being told that her MS lesions were "dried up" and that she did not need to be on a medication.  Imaging: The MRI of the brain performed 03/11/2017 does not show any new MS lesions performed about 18 months earlier.  There is slight progression of the atrophy though this is generalized.  MRI of the brain 05/13/2018 showed no new lesions.  MRI of the brain 07/04/2020 showed no new lesions due to the MS compared to the MRI from 2020.  There was a chronic microhemorrhage in the right thalamus.  MRI from 04/14/2022 showed multiple foci of restricted diffusion predominantly in the right temporal lobe with smaller scattered foci in the right parietal lobe and a very small focus in the periventricular left parietal lobe.  There was avid enhancement of some of these foci.     CT angiogram 04/15/2022 showed moderate right internal carotid artery cavernous segment stenosis and mild bilateral carotid bifurcation atherosclerosis.   REVIEW OF SYSTEMS:  Constitutional: No fevers, chills, sweats, or  change in appetite.  She has fatigued and poor sleep Eyes: No visual changes, double vision, eye pain Ear, nose and throat: No hearing loss, ear pain, nasal congestion, sore throat Cardiovascular: No chest pain, palpitations Respiratory:  No shortness of breath at rest or with exertion.   No wheezes.   She snores GastrointestinaI: No nausea, vomiting, diarrhea, abdominal pain, fecal incontinence Genitourinary:  see above.    Musculoskeletal:  Notes R hip pain Integumentary: No rash, pruritus, skin lesions Neurological: as above Psychiatric: Mild depression and anxiety Endocrine: No palpitations, diaphoresis, change in appetite, change in weigh or increased thirst Hematologic/Lymphatic:  No anemia, purpura, petechiae. Allergic/Immunologic: No itchy/runny eyes, nasal congestion, recent allergic reactions, rashes  ALLERGIES: No Known Allergies  HOME MEDICATIONS: Outpatient Medications Prior to Visit  Medication Sig Dispense Refill   amLODipine (NORVASC) 10 MG tablet Take 10 mg by mouth daily. For HTN     amphetamine-dextroamphetamine (ADDERALL XR) 10 MG 24 hr capsule Take 1 capsule (10 mg total) by mouth daily. 30 capsule 0   donepezil (ARICEPT) 10 MG tablet Take 10 mg by mouth at bedtime.     FLUoxetine (PROZAC) 20 MG capsule Take 20 mg by mouth daily. For depression     FLUZONE HIGH-DOSE 0.5 ML SUSY TO BE ADMINISTERED BY PHARMACIST FOR IMMUNIZATION  0   memantine (NAMENDA) 10 MG tablet Take 1 tablet by mouth 2 (two) times daily.     metoprolol succinate (TOPROL-XL) 25 MG 24 hr tablet      oxybutynin (DITROPAN) 5 MG tablet Take 1 tablet by mouth 2 (two) times daily.     risperiDONE (RISPERDAL) 0.5 MG tablet Take by mouth.     Vitamin D, Ergocalciferol, (DRISDOL) 1.25 MG (50000 UNIT) CAPS capsule Take 1 capsule (50,000 Units total) by mouth every 7 (seven) days. 13 capsule 0   No facility-administered medications prior to visit.    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    FAMILY HISTORY: Family History  Problem Relation Age of Onset   Diabetes Mother    Hypertension Mother    Cancer Mother        COLON   Breast cancer Mother    Diabetes Maternal Grandmother    Diabetes Paternal Grandmother    Cancer Father  PROSTATE AND LUNG    SOCIAL HISTORY:  Social History   Socioeconomic History   Marital status: Widowed    Spouse name: Not on file   Number of children: Not on file   Years of education: Not on file   Highest education level: Not on file  Occupational History   Not on file  Tobacco Use   Smoking status: Former    Years: 20.00    Types: Cigarettes    Quit date: 02/28/1988    Years since quitting: 34.1   Smokeless tobacco: Never  Substance and Sexual Activity   Alcohol use: No    Alcohol/week: 0.0 standard drinks of alcohol    Comment: rare   Drug use: No   Sexual activity: Never    Comment: 1st intercourse- 17, partners- 2  Other Topics Concern   Not on file  Social History Narrative   Not on file   Social Determinants of Health   Financial Resource Strain: Not on file  Food Insecurity: Not on file  Transportation Needs: Not on file  Physical Activity: Not on file  Stress: Not on file  Social Connections: Not on file  Intimate Partner Violence: Not on file     PHYSICAL EXAM  There were no vitals filed for this visit.     General: The patient is well-developed and well-nourished and in no acute distress.   She has a positive right Faber sign.   Mildly tender over right trochanteric bursa.      Neurologic Exam  Mental status: The patient is alert and oriented x 2  (not date) .  Focus and attention is reduced (WORLD-???).  Short-term memory is reduced (0/3 without hints and 2/3 with hints).  Speech is normal.  Cranial nerves: Extraocular movements are full.  Facial strength is normal.  Trapezius strength is normal.  No obvious hearing deficits are noted.  Motor:  Muscle bulk is normal and tone is slightly increased in legs.  Strength is 5/5 in the arms and legs..   Sensory: She has intact sensation to touch and vibration in all 4 extremities  Coordination: Cerebellar testing reveals good finger-nose-finger and mildly reduced heel-to-shin bilaterally.  Gait and station: Station is normal with eyes open and closed.  Her gait is wide but she has a reasonably good stride.  Tandem gait is wide.  Romberg is borderline.  Her turns are unbalanced.  Reflexes: Deep tendon reflexes are symmetric and brisk bilaterally.        ASSESSMENT AND PLAN   No diagnosis found.   1.   Her MS continues to be stable off of a disease modifying therapy.  No new lesions were seen on the MRI in 2022.  2.   She has cognitive impairment mostly affecting memory.  Unclear if difficulty with memory is due entirely to the MS or to combination of MS and a neurodegenerative process such as Alzheimer's disease.  She is on donepezil and Namenda.   3.   Continue Adderall XR 10 mg daily.  Her daughter states she does much better with cognition and mood on that medication.  She will also continue medications prescribed by psychiatry. 4.  We had discussed that oxybutynin might worsen memory.  However, due to the severity of her urinary in continence and its impact she would like to remain on the medication..  5.   She will return to see me in 6 months or sooner if there are new or worsening neurologic symptoms.  Willona Phariss A. Felecia Shelling, MD, PhD 0/09/3708, 6:26 PM Certified in Neurology, Clinical Neurophysiology, Sleep  Medicine, Pain Medicine and Neuroimaging  [facility] [address] [place], [place] [address] [phone]

## 2022-05-04 ENCOUNTER — Other Ambulatory Visit: Payer: Self-pay | Admitting: *Deleted

## 2022-05-04 ENCOUNTER — Encounter: Payer: Self-pay | Admitting: Neurology

## 2022-05-04 ENCOUNTER — Ambulatory Visit: Payer: Medicare Other | Admitting: Neurology

## 2022-05-04 ENCOUNTER — Telehealth: Payer: Self-pay | Admitting: *Deleted

## 2022-05-04 VITALS — BP 139/47 | HR 84

## 2022-05-04 DIAGNOSIS — N390 Urinary tract infection, site not specified: Secondary | ICD-10-CM | POA: Diagnosis not present

## 2022-05-04 DIAGNOSIS — R451 Restlessness and agitation: Secondary | ICD-10-CM

## 2022-05-04 DIAGNOSIS — F03B11 Unspecified dementia, moderate, with agitation: Secondary | ICD-10-CM | POA: Diagnosis not present

## 2022-05-04 DIAGNOSIS — G35 Multiple sclerosis: Secondary | ICD-10-CM

## 2022-05-04 DIAGNOSIS — I639 Cerebral infarction, unspecified: Secondary | ICD-10-CM | POA: Diagnosis not present

## 2022-05-04 DIAGNOSIS — R4189 Other symptoms and signs involving cognitive functions and awareness: Secondary | ICD-10-CM

## 2022-05-04 DIAGNOSIS — Z8673 Personal history of transient ischemic attack (TIA), and cerebral infarction without residual deficits: Secondary | ICD-10-CM

## 2022-05-04 DIAGNOSIS — R5382 Chronic fatigue, unspecified: Secondary | ICD-10-CM

## 2022-05-04 MED ORDER — LORAZEPAM 0.5 MG PO TABS: 0.5000 mg | ORAL_TABLET | Freq: Three times a day (TID) | ORAL | 1 refills | Status: AC

## 2022-05-04 MED ORDER — DIVALPROEX SODIUM 250 MG PO DR TAB: 250.0000 mg | DELAYED_RELEASE_TABLET | Freq: Two times a day (BID) | ORAL | 5 refills | Status: AC

## 2022-05-04 NOTE — Telephone Encounter (Signed)
Called daughter, Tilda Burrow. She will bring mother to the following [facility] for UA/culture:   [place], [place], [place] [address]. I released lab orders.  Daughter wondering if Timmie can continue trazodone? Daughter couldnt remember what  Dr. Felecia Shelling recommended at visit. Dr Sheppard Coil is having her currently take 100mg  po qhs. Aware I will speak with MD and call her back.

## 2022-05-05 NOTE — Telephone Encounter (Signed)
Called daughter back. Relayed Dr. Garth Bigness message. She verbalized understanding.  She states mother's sx worsened after appt yesterday. Stayed up all night, throwing items. She was unable to get urine sample. She called EMS this am for them to bring her to [facility] for urgent evaluation/treatment. Aware I will send update to Dr. Felecia Shelling to let him know.

## 2022-05-05 NOTE — Telephone Encounter (Signed)
LVM for daughter to call office. 

## 2022-05-27 DEATH — deceased

## 2022-05-30 ENCOUNTER — Telehealth: Payer: Self-pay | Admitting: Neurology

## 2022-05-30 NOTE — Telephone Encounter (Signed)
Called pt daughter to discuss pt 07/18/2022 appointment. Pt's daughter said the pt had passed away and she wanted to let the office know.

## 2022-05-30 NOTE — Telephone Encounter (Signed)
Pt daughter called. Stated she wants to talk to nurse about appointment on 4/22.

## 2022-07-18 ENCOUNTER — Ambulatory Visit: Payer: Medicare Other | Admitting: Family Medicine

## 2022-11-03 IMAGING — MR MR HEAD WO/W CM
9 of 11 series · 36 of 48 positions shown · IV contrast (MULTIHANCE)
Comparison: MRI head 05/12/2018

CLINICAL DATA: Multiple sclerosis, monitoring

EXAM:
MRI HEAD WITHOUT AND WITH CONTRAST
TECHNIQUE: Multiplanar, multiecho pulse sequences of the brain and surrounding
structures were obtained without and with intravenous contrast.
CONTRAST:  7.5mL GADAVIST GADOBUTROL 1 MMOL/ML IV SOLN

[Series 3: DWI · axial · 3.0mm · 2.19mm/px · z∈[-63,+92]mm · 11 of 96 slices shown (1 of 2)]
[im 1/96]
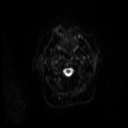
[im 10/96]
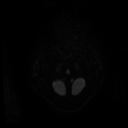
[im 20/96]
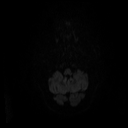
[im 29/96]
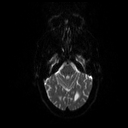
[im 39/96]
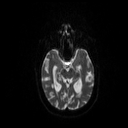
[im 48/96]
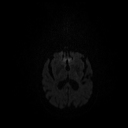
[im 58/96]
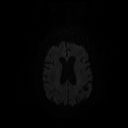
[im 67/96]
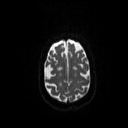
[im 77/96]
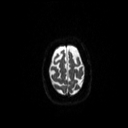
[im 86/96]
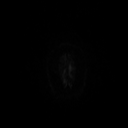
[im 96/96]
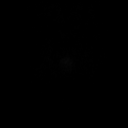

[Series 4: DWI · axial · 3.0mm · 2.19mm/px · z∈[-63,+92]mm · 5 of 48 slices shown (2 of 2)]
[im 1/48]
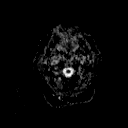
[im 12/48]
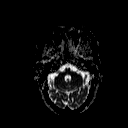
[im 24/48]
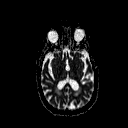
[im 36/48]
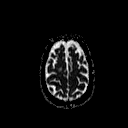
[im 48/48]
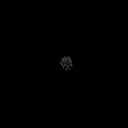

[Series 5: FLAIR · sagittal · 4.0mm · 0.47mm/px · 3 of 28 slices shown (1 of 2)]
[im 1/28]
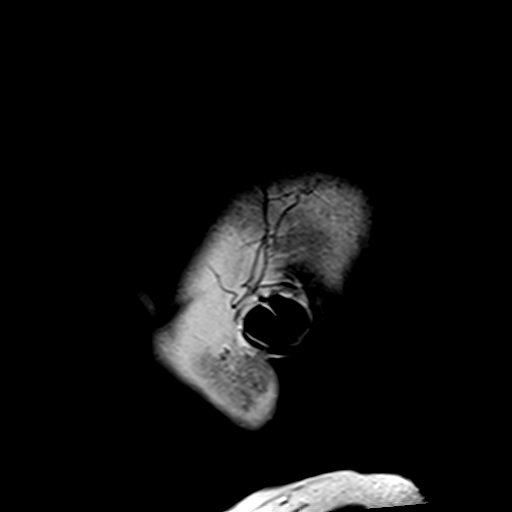
[im 14/28]
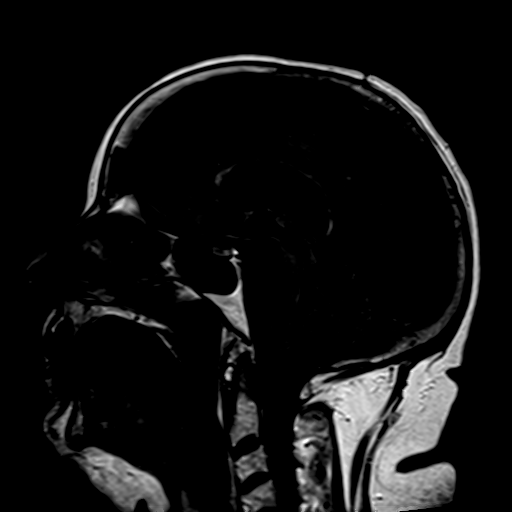
[im 28/28]
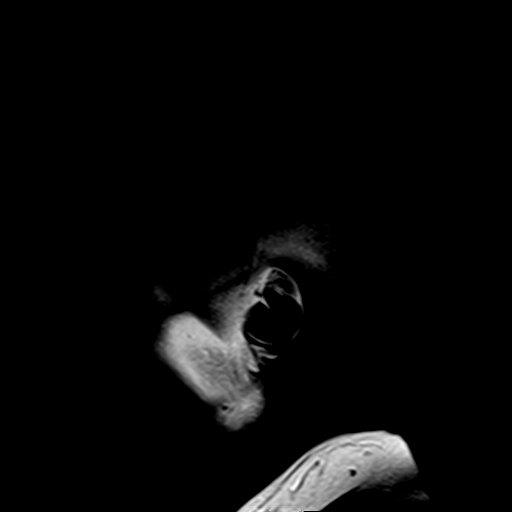

[Series 6: FLAIR · axial · 3.0mm · 0.45mm/px · z∈[-37,+111]mm · 3 of 26 slices shown (2 of 2)]
[im 1/26]
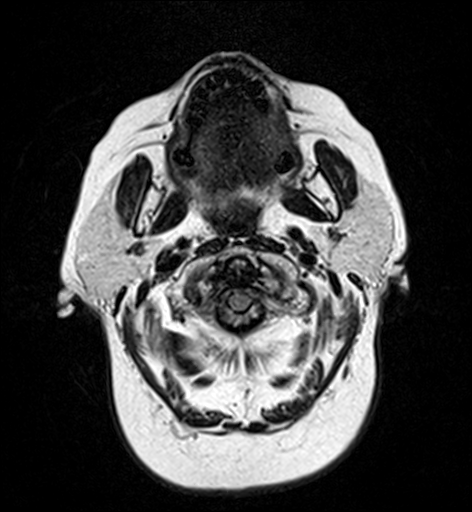
[im 13/26]
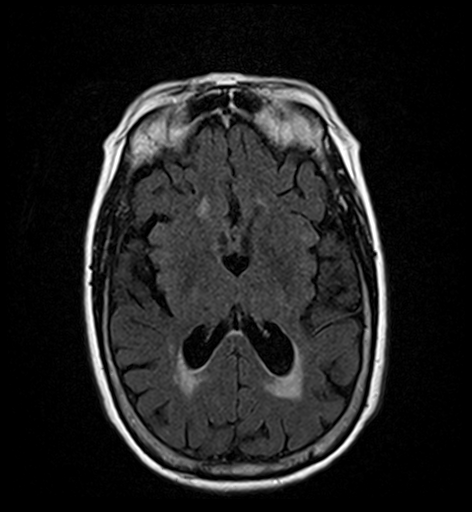
[im 26/26]
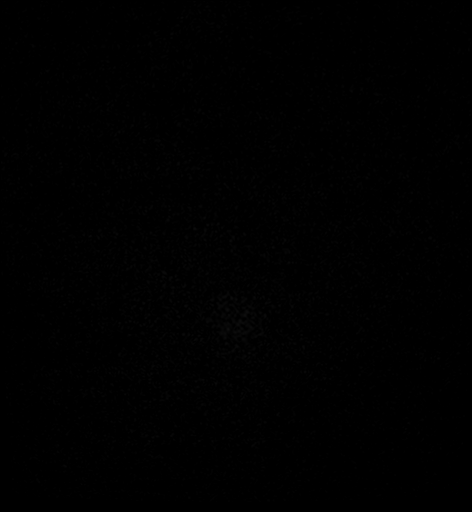

[Series 7: T2 · axial · 5.0mm · 0.45mm/px · z∈[-42,+118]mm · 3 of 28 slices shown (1 of 2)]
[im 1/28]
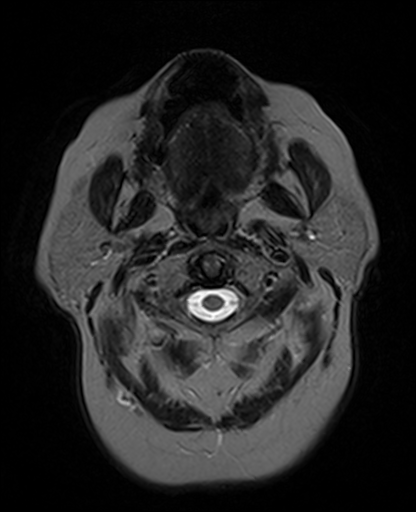
[im 14/28]
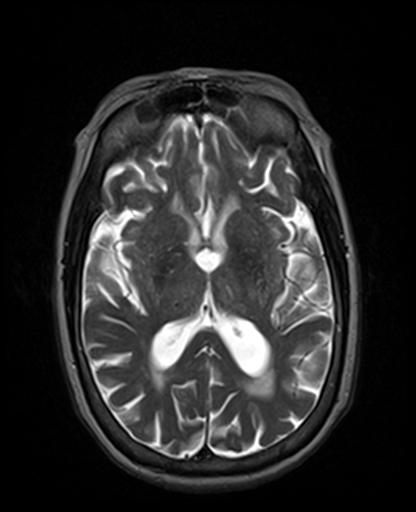
[im 28/28]
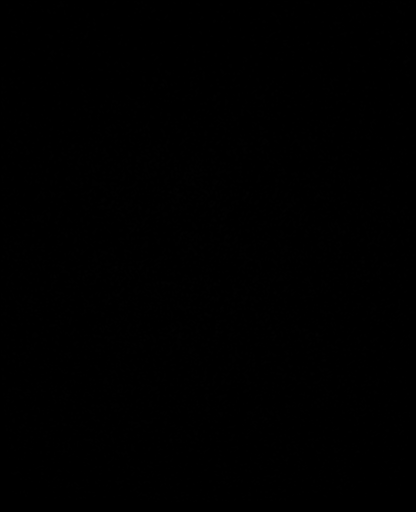

[Series 8: T2 · axial · 5.0mm · 0.45mm/px · z∈[-42,+118]mm · 3 of 28 slices shown (2 of 2)]
[im 1/28]
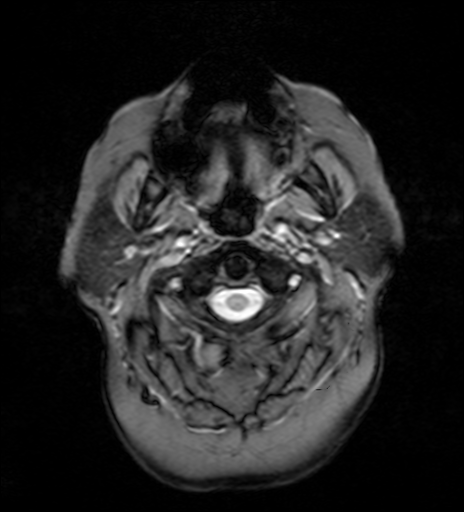
[im 14/28]
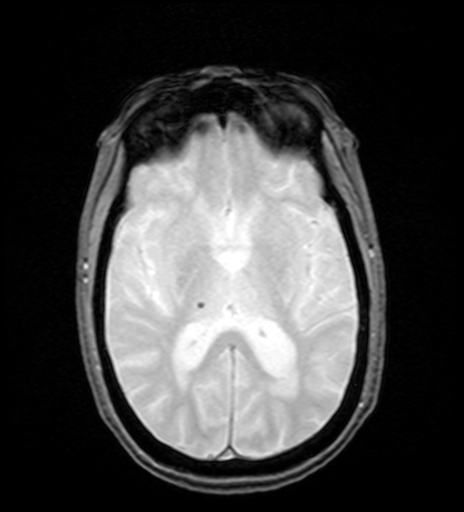
[im 28/28]
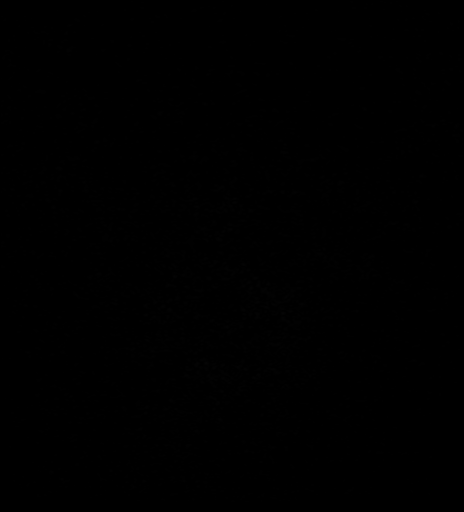

[Series 10: T1 · sagittal · 5.0mm · 0.47mm/px · 2 of 23 slices shown]
[im 1/23]
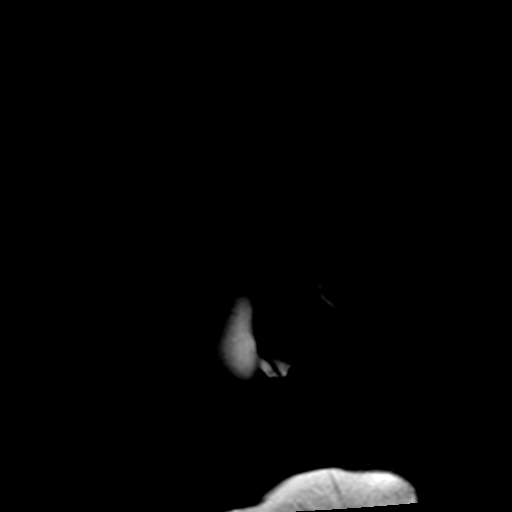
[im 23/23]
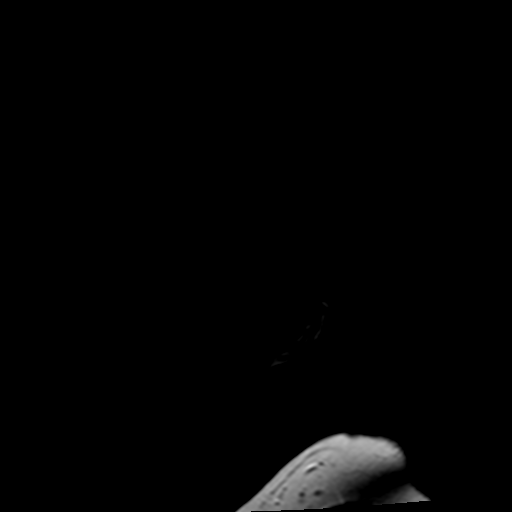

[Series 11: T2 post-contrast · coronal · 5.0mm · 0.47mm/px · 3 of 28 slices shown]
[im 1/28]
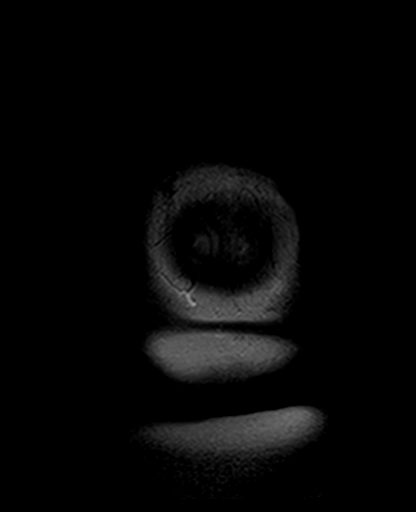
[im 14/28]
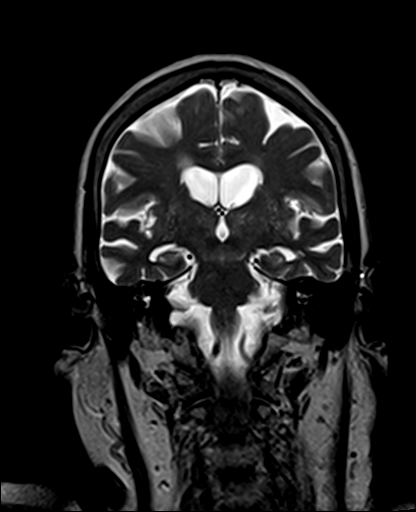
[im 28/28]
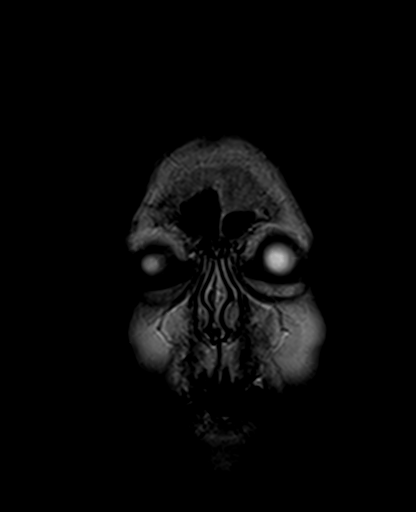

[Series 13: T1 post-contrast · coronal · 5.0mm · 0.47mm/px · 3 of 28 slices shown]
[im 1/28]
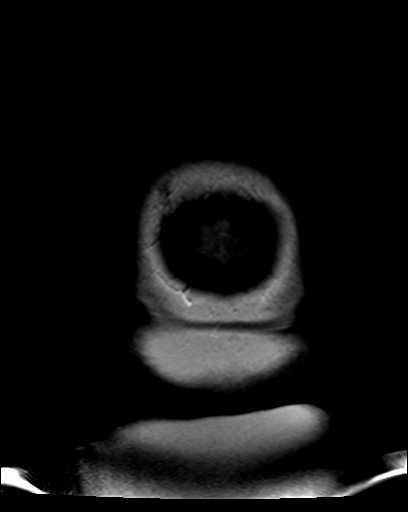
[im 14/28]
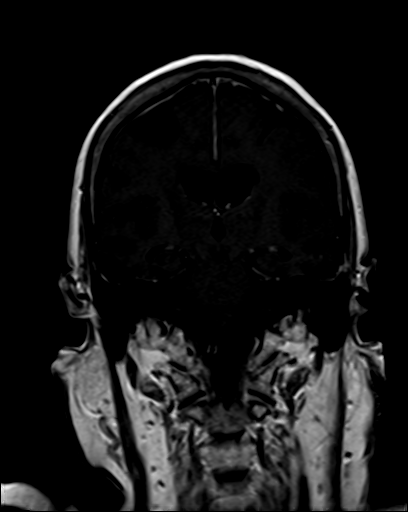
[im 28/28]
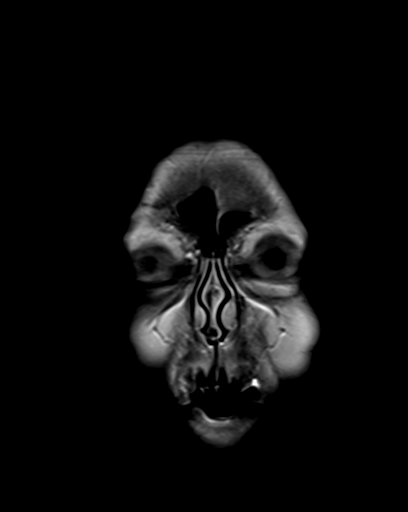

[36 of 48 positions shown; findings below may reference images not displayed]

FINDINGS: Brain: Multiple periventricular white matter hyperintensities in
both cerebral hemispheres are stable from the prior study.
Hyperintensity in the left superior cerebellum also stable.
Brainstem and brachium pontis normal.

Negative for acute infarct. No areas of restricted diffusion.
Chronic microhemorrhage in the right thalamus, not seen previously.
Negative for mass lesion. Ventricle size is normal. Mild cerebral
atrophy appears stable.

Vascular: Normal arterial flow voids

Skull and upper cervical spine: No focal skeletal lesion.

Sinuses/Orbits: Paranasal sinuses clear. Bilateral cataract
extraction

Other: None
IMPRESSION: Periventricular white matter hyperintensities compatible with
multiple sclerosis without interval change.

New small area of chronic microhemorrhage in the right thalamus.
